# Patient Record
Sex: Male | Born: 1965 | Race: Black or African American | Hispanic: No | Marital: Single | Smoking: Current every day smoker
Health system: Southern US, Community
[De-identification: ages and names within clinical notes are randomized; demographics above are authoritative.]

## PROBLEM LIST (undated history)

## (undated) HISTORY — PX: APPENDECTOMY: SHX54

---

## 2003-09-29 ENCOUNTER — Other Ambulatory Visit: Payer: Self-pay

## 2004-09-04 ENCOUNTER — Emergency Department: Payer: Self-pay | Admitting: Emergency Medicine

## 2005-04-04 ENCOUNTER — Emergency Department: Payer: Self-pay | Admitting: Emergency Medicine

## 2006-01-12 ENCOUNTER — Emergency Department: Payer: Self-pay | Admitting: Unknown Physician Specialty

## 2006-01-16 ENCOUNTER — Emergency Department: Payer: Self-pay | Admitting: Unknown Physician Specialty

## 2007-06-15 ENCOUNTER — Emergency Department: Payer: Self-pay | Admitting: Emergency Medicine

## 2008-09-01 ENCOUNTER — Emergency Department: Payer: Self-pay | Admitting: Emergency Medicine

## 2011-12-31 ENCOUNTER — Emergency Department: Payer: Self-pay | Admitting: Internal Medicine

## 2014-09-23 ENCOUNTER — Emergency Department
Admit: 2014-09-23 | Disposition: A | Discharge status: Home / Self Care | Payer: Self-pay | Admitting: Emergency Medicine

## 2014-12-22 ENCOUNTER — Emergency Department
Admission: EM | Admit: 2014-12-22 | Discharge: 2014-12-22 | Disposition: A | Discharge status: Home / Self Care | Payer: No Typology Code available for payment source | Attending: Student | Admitting: Student

## 2014-12-22 DIAGNOSIS — Z880 Allergy status to penicillin: Secondary | ICD-10-CM | POA: Diagnosis not present

## 2014-12-22 DIAGNOSIS — H5711 Ocular pain, right eye: Secondary | ICD-10-CM | POA: Diagnosis not present

## 2014-12-22 MED ORDER — TETRACAINE HCL 0.5 % OP SOLN
OPHTHALMIC | Status: DC
Start: 2014-12-22 — End: 2014-12-23
  Filled 2014-12-22: qty 2

## 2014-12-22 MED ORDER — EYE WASH OPHTH SOLN
OPHTHALMIC | Status: AC
  Administered 2014-12-22
  Filled 2014-12-22: qty 118

## 2014-12-22 MED ORDER — OLOPATADINE HCL 0.1 % OP SOLN: 1.0000 [drp] | Freq: Two times a day (BID) | OPHTHALMIC | Status: AC

## 2014-12-22 MED ORDER — FLUORESCEIN SODIUM 1 MG OP STRP
ORAL_STRIP | OPHTHALMIC | Status: AC
  Filled 2014-12-22: qty 1

## 2014-12-22 NOTE — ED Notes (Signed)
Pt to ED with R eye redness, states he thinks a bug may have flown into his eye.

## 2014-12-22 NOTE — ED Provider Notes (Signed)
[facility] Emergency Department Provider Note  ____________________________________________  Time seen: Approximately 11:15 PM  I have reviewed the triage vital signs and the nursing notes.   HISTORY  Chief Complaint Eye Pain    HPI Samuel Dixon is a 49 y.o. male patient state right eye redness and irritation. Patient state he believed bug flew into his eye yesterday. Patient denies any vision disturbance. Patient is rating his pain/ discomfort as a 6/10.Palliative measures taken since the incident.   No past medical history on file.  There are no active problems to display for this patient.   No past surgical history on file.  No current outpatient prescriptions on file.  Allergies Penicillins and Sulfa antibiotics  No family history on file.  Social History History  Substance Use Topics  . Smoking status: Not on file  . Smokeless tobacco: Not on file  . Alcohol Use: Not on file    Review of Systems Constitutional: No fever/chills Eyes: No visual changes. Right eye irritation ENT: No sore throat. Cardiovascular: Denies chest pain. Respiratory: Denies shortness of breath. Gastrointestinal: No abdominal pain.  No nausea, no vomiting.  No diarrhea.  No constipation. Genitourinary: Negative for dysuria. Musculoskeletal: Negative for back pain. Skin: Negative for rash. Neurological: Negative for headaches, focal weakness or numbness. 10-point ROS otherwise negative.  ____________________________________________   PHYSICAL EXAM:  VITAL SIGNS: ED Triage Vitals  Enc Vitals Group     BP 12/22/14 2251 144/91 mmHg     Pulse Rate 12/22/14 2251 87     Resp 12/22/14 2251 18     Temp 12/22/14 2251 97.9 F (36.6 C)     Temp Source 12/22/14 2251 Oral     SpO2 12/22/14 2251 99 %     Weight 12/22/14 2251 220 lb (99.791 kg)     Height 12/22/14 2251 5\' 5"  (1.651 m)     Head Cir --      Peak Flow --      Pain Score 12/22/14 2252 6      Pain Loc --      Pain Edu? --      Excl. in GC? --     Constitutional: Alert and oriented. Well appearing and in no acute distress. Eyes: Conjunctivae are normal. PERRL. EOMI. Head: Atraumatic. Nose: No congestion/rhinnorhea. Mouth/Throat: Mucous membranes are moist.  Oropharynx non-erythematous. Neck: No stridor. No cervical spine tenderness to palpation. Hematological/Lymphatic/Immunilogical: No cervical lymphadenopathy. Cardiovascular: Normal rate, regular rhythm. Grossly normal heart sounds.  Good peripheral circulation. Respiratory: Normal respiratory effort.  No retractions. Lungs CTAB. Gastrointestinal: Soft and nontender. No distention. No abdominal bruits. No CVA tenderness. Musculoskeletal: No lower extremity tenderness nor edema.  No joint effusions. Neurologic:  Normal speech and language. No gross focal neurologic deficits are appreciated. Speech is normal. No gait instability. Skin:  Skin is warm, dry and intact. No rash noted. Psychiatric: Mood and affect are normal. Speech and behavior are normal.  ____________________________________________   LABS (all labs ordered are listed, but only abnormal results are displayed)  Labs Reviewed - No data to display ____________________________________________  EKG   ____________________________________________  RADIOLOGY   ____________________________________________   PROCEDURES  Procedure(s) performed: See procedure note  Critical Care performed: No  Tetracaine eyedrops were applied to the right eye. __Fluoroscopy stain revealed no foreign body or corneal lesions. Eye was then irrigated with 20 mL of normal saline. Patient tolerated procedure no complaints. ___________________________   INITIAL IMPRESSION / ASSESSMENT AND PLAN / ED COURSE  Pertinent labs &  imaging results that were available during my care of the patient were reviewed by me and considered in my medical decision making (see chart for  details).  Cornea irritation to the right eye. This patient to use any histamine eyedrops for the next 2-3 days. Patient advised to follow-up with [facility] if his condition worsens. Patient was discharged with Patanol eyedrops to use as directed. ____________________________________________   FINAL CLINICAL IMPRESSION(S) / ED DIAGNOSES  Final diagnoses:  Eye pain, right      Joni Reining, PA-C 12/22/14 1610  Gayla Doss, MD 12/26/14 9604

## 2014-12-22 NOTE — Discharge Instructions (Signed)
Use eye drops as directed.

## 2015-09-04 ENCOUNTER — Encounter: Payer: Self-pay | Admitting: Emergency Medicine

## 2015-09-04 ENCOUNTER — Emergency Department
Admission: EM | Admit: 2015-09-04 | Discharge: 2015-09-04 | Disposition: A | Discharge status: Home / Self Care | Payer: No Typology Code available for payment source | Attending: Emergency Medicine | Admitting: Emergency Medicine

## 2015-09-04 DIAGNOSIS — Z79899 Other long term (current) drug therapy: Secondary | ICD-10-CM | POA: Insufficient documentation

## 2015-09-04 DIAGNOSIS — Z880 Allergy status to penicillin: Secondary | ICD-10-CM | POA: Insufficient documentation

## 2015-09-04 DIAGNOSIS — F17200 Nicotine dependence, unspecified, uncomplicated: Secondary | ICD-10-CM | POA: Insufficient documentation

## 2015-09-04 DIAGNOSIS — K047 Periapical abscess without sinus: Secondary | ICD-10-CM | POA: Diagnosis not present

## 2015-09-04 DIAGNOSIS — K0889 Other specified disorders of teeth and supporting structures: Secondary | ICD-10-CM | POA: Diagnosis present

## 2015-09-04 MED ORDER — CLINDAMYCIN HCL 300 MG PO CAPS: 300.0000 mg | ORAL_CAPSULE | Freq: Three times a day (TID) | ORAL | Status: DC

## 2015-09-04 MED ORDER — OXYCODONE-ACETAMINOPHEN 5-325 MG PO TABS: 1.0000 | ORAL_TABLET | Freq: Four times a day (QID) | ORAL | Status: DC | PRN

## 2015-09-04 NOTE — ED Notes (Signed)
Pt presents to ED via POV from home with c/o of right lower dental pain. Pt states"I think I need antibiotics." Pt states presenting sx related to "infected tooth". Pt states area is swollen and sore. Pt denies abnormal drainage at this time. Pt alert and oriented x4.

## 2015-09-04 NOTE — ED Notes (Signed)
Patient with no complaints at this time. Respirations even and unlabored. Skin warm/dry. Discharge instructions reviewed with patient at this time. Patient given opportunity to voice concerns/ask questions. Patient discharged at this time and left Emergency Department with steady gait.   

## 2015-09-04 NOTE — Discharge Instructions (Signed)
OPTIONS FOR DENTAL FOLLOW UP CARE ° °[place] Department of Health and Human Services - Local Safety Net Dental Clinics °http://www.ncdhhs.gov/dph/oralhealth/services/safetynetclinics.htm °  °[facility] (336-562-3123) ° °[facility] [place] ([phone]) ° °[facility] ([phone] ext [phone]) ° °[facility] ([phone]) ° °[facility] ([phone]) °This clinic caters to the indigent population and is on a lottery system. °Location: °[facility], [address], [place] °Clinic Hours: °Wednesdays from 6pm - 9pm, patients seen by a lottery system. °For dates, call or go to www.med.[facility].edu/[facility]/patients/Dental-[facility] °Services: °Cleanings, fillings and simple extractions. °Payment Options: °DENTAL WORK IS FREE OF CHARGE. Bring proof of income or support. °Best way to get seen: °Arrive at 5:15 pm - this is a lottery, NOT first come/first serve, so arriving earlier will not increase your chances of being seen. °  °  °[facility] °[phone] °Select option 1 for emergencies °  °Location: °[facility], [address], [place] °Clinic Hours: °No walk-ins accepted - call the day before to schedule an appointment. °Check in times are 9:30 am and 1:30 pm. °Services: °Simple extractions, temporary fillings, pulpectomy/pulp debridement, uncomplicated abscess drainage. °Payment Options: °PAYMENT IS DUE AT THE TIME OF SERVICE.  Fee is usually $100-200, additional surgical procedures (e.g. abscess drainage) may be extra. °Cash, checks, Visa/MasterCard accepted.  Can file Medicaid if patient is covered for dental - patient should call case worker to check. °No discount for [facility] patients. °Best way to get seen: °MUST call the day before and get onto the schedule. Can usually be seen the next 1-2 days. No walk-ins accepted. °  °  °[facility] °[phone] °   °Location: °[facility], [address], [place] °Clinic Hours: °M, W, Th, F 8am or 1:30pm, Tues 9a or 1:30 - first come/first served. °Services: °Simple extractions, temporary fillings, uncomplicated abscess drainage.  You do not need to be an [place] resident. °Payment Options: °PAYMENT IS DUE AT THE TIME OF SERVICE. °Dental insurance, otherwise sliding scale - bring proof of income or support. °Depending on income and treatment needed, cost is usually $50-200. °Best way to get seen: °Arrive early as it is first come/first served. °  °  °[facility] °[phone] °  °Location: °[address] °Clinic Hours: °Mon-Thu 8a-5p °Services: °Most basic dental services including extractions and fillings. °Payment Options: °PAYMENT IS DUE AT THE TIME OF SERVICE. °Sliding scale, up to 50% off - bring proof if income or support. °Medicaid with dental option accepted. °Best way to get seen: °Call to schedule an appointment, can usually be seen within 2 weeks OR they will try to see walk-ins - show up at 8a or 2p (you may have to wait). °  °  °[facility] °919-245-2435 °[place] RESIDENTS ONLY °  °Location: °[facility], [address], [place], [place] [address] °Clinic Hours: By appointment only. °Monday - Thursday 8am-5pm, Friday 8am-12pm °Services: Cleanings, fillings, extractions. °Payment Options: °PAYMENT IS DUE AT THE TIME OF SERVICE. °Cash, Visa or MasterCard. Sliding scale - $30 minimum per service. °Best way to get seen: °Come in to office, complete packet and make an appointment - need proof of income °or support monies for each household member and proof of [place] residence. °Usually takes about a month to get in. °  °  °[facility] °[phone] °  °Location: °[address]   [place] °Clinic Hours: Walk-in Urgent Care Dental Services are offered Monday-Friday  mornings only. °The numbers of emergencies accepted daily is limited to the number of °providers available. °Maximum 15 - Mondays, Wednesdays & Thursdays °Maximum 10 - Tuesdays & Fridays °Services: °You do not need to be a [place] resident to be seen for a dental emergency. °Emergencies are defined as pain, swelling, abnormal bleeding, or dental trauma. Walkins will receive x-rays if needed. °NOTE: Dental cleaning is not an emergency. °Payment Options: °PAYMENT IS DUE AT THE TIME OF SERVICE. °Minimum co-pay is $40.00 for uninsured patients. °Minimum co-pay is $3.00 for Medicaid with dental coverage. °Dental Insurance is accepted and must be presented at time of visit. °Medicare does not cover dental. °Forms of payment: Cash, credit card, checks. °Best way to get seen: °If not previously registered with the clinic, walk-in dental registration begins at 7:15 am and is on a first come/first serve basis. °If previously registered with the clinic, call to make an appointment. °  °  °[facility] °[phone] °[place] RESIDENTS ONLY °  °Location: °[address], [place], [place] °Clinic Hours: °Mon-Thu 10a-2p °Services: Extractions only! °Payment Options: °FREE (donations accepted) - bring proof of income or support °Best way to get seen: °Call and schedule an appointment OR come at 8am on the 1st Monday of every month (except for holidays) when it is first come/first served. °  °  °[facility] °[phone] °  °Location: °[address], [place] °Clinic Hours: °Friday mornings °Services, Payment Options, Best way to get seen: °Call for info ° ° °Dental Abscess °A dental abscess is a collection of pus in or around a tooth. °CAUSES °This condition is caused by a bacterial infection around the root of the tooth that involves the inner part of the tooth (pulp). It may result from: °· Severe tooth decay. °· Trauma to the tooth that allows bacteria to enter into the pulp, such as a broken or chipped  tooth. °· Severe gum disease around a tooth. °SYMPTOMS °Symptoms of this condition include: °· Severe pain in and around the infected tooth. °· Swelling and redness around the infected tooth, in the mouth, or in the face. °· Tenderness. °· Pus drainage. °· Bad breath. °· Bitter taste in the mouth. °· Difficulty swallowing. °· Difficulty opening the mouth. °· Nausea. °· Vomiting. °· Chills. °· Swollen neck glands. °· Fever. °DIAGNOSIS °This condition is diagnosed with examination of the infected tooth. During the exam, your dentist may tap on the infected tooth. Your dentist will also ask about your medical and dental history and may order X-rays. °TREATMENT °This condition is treated by eliminating the infection. This may be done with: °· Antibiotic medicine. °· A root canal. This may be performed to save the tooth. °· Pulling (extracting) the tooth. This may also involve draining the abscess. This is done if the tooth cannot be saved. °HOME CARE INSTRUCTIONS °· Take medicines only as directed by your dentist. °· If you were prescribed antibiotic medicine, finish all of it even if you start to feel better. °· Rinse your mouth (gargle) often with salt water to relieve pain or swelling. °· Do not drive or operate heavy machinery while taking pain medicine. °· Do not apply heat to the outside of your mouth. °· Keep all follow-up visits as directed by your dentist. This is important. °SEEK MEDICAL CARE IF: °· Your pain is worse and is not helped by medicine. °SEEK IMMEDIATE MEDICAL CARE IF: °·   You have a fever or chills. °· Your symptoms suddenly get worse. °· You have a very bad headache. °· You have problems breathing or swallowing. °· You have trouble opening your mouth. °· You have swelling in your neck or around your eye. °  °This information is not intended to replace advice given to you by your health care provider. Make sure you discuss any questions you have with your health care provider. °  °Document  Released: 06/03/2005 Document Revised: 10/18/2014 Document Reviewed: 05/31/2014 °Elsevier Interactive Patient Education ©2016 [employer] Inc[employer] ° °

## 2015-09-04 NOTE — ED Provider Notes (Signed)
[facility] Emergency Department Provider Note ____________________________________________  Time seen: Approximately 8:56 PM  I have reviewed the triage vital signs and the nursing notes.   HISTORY  Chief Complaint Dental Pain   HPI Yacoub Diltz is a 50 y.o. male who presents to the emergency department for evaluation of a dental abscess. He states that approximately 3 days ago he began having dental pain and today his right lower jaw is swollen. He does not have a dentist. He has not taken anything for pain.  History reviewed. No pertinent past medical history.  There are no active problems to display for this patient.   Past Surgical History  Procedure Laterality Date  . Appendectomy      Current Outpatient Rx  Name  Route  Sig  Dispense  Refill  . LORATADINE PO   Oral   Take by mouth.         . clindamycin (CLEOCIN) 300 MG capsule   Oral   Take 1 capsule (300 mg total) by mouth 3 (three) times daily.   30 capsule   0   . olopatadine (PATANOL) 0.1 % ophthalmic solution   Right Eye   Place 1 drop into the right eye 2 (two) times daily.   5 mL   0   . oxyCODONE-acetaminophen (ROXICET) 5-325 MG tablet   Oral   Take 1 tablet by mouth every 6 (six) hours as needed.   9 tablet   0     Allergies Penicillins and Sulfa antibiotics  No family history on file.  Social History Social History  Substance Use Topics  . Smoking status: Current Every Day Smoker  . Smokeless tobacco: None  . Alcohol Use: No    Review of Systems Constitutional: No fever/chills ENT: No sore throat. Gastrointestinal: No nausea, no vomiting.  Musculoskeletal: Negative for pain. Skin: Positive for swelling of the right lower jaw Neurological: Negative for headaches, focal weakness or numbness.   ____________________________________________   PHYSICAL EXAM:  VITAL SIGNS: ED Triage Vitals  Enc Vitals Group     BP 09/04/15 2001 144/85 mmHg   Pulse Rate 09/04/15 2001 85     Resp 09/04/15 2001 18     Temp 09/04/15 2001 98.2 F (36.8 C)     Temp Source 09/04/15 2001 Oral     SpO2 09/04/15 2001 96 %     Weight 09/04/15 2001 220 lb (99.791 kg)     Height 09/04/15 2001  (1.651 m)     Head Cir --      Peak Flow --      Pain Score 09/04/15 2007 10     Pain Loc --      Pain Edu? --      Excl. in GC? --     Constitutional: Alert and oriented. Well appearing and in no acute distress. Eyes: Conjunctivae are normal. PERRL. EOMI. Head: Atraumatic. Nose: No congestion/rhinnorhea. Mouth/Throat: Mucous membranes are moist.  Oropharynx non-erythematous. Periodontal Exam: No fluctuant area noted around the gumline of the right lower jaw Neck: No stridor.  Hematological/Lymphatic/Immunilogical: No cervical lymphadenopathy. Respiratory: Normal respiratory effort.  No retractions. Musculoskeletal: Full ROM x 4 extremities. Neurologic:  Normal speech and language. No gross focal neurologic deficits are appreciated. Speech is normal. No gait instability. Skin:  Skin is warm, dry and intact. Mild edema of the right lower jaw. Psychiatric: Mood and affect are normal. Speech and behavior are normal.  ____________________________________________   LABS (all labs ordered are listed,  but only abnormal results are displayed)  Labs Reviewed - No data to display ____________________________________________   RADIOLOGY   ____________________________________________   PROCEDURES  Procedure(s) performed: None  Critical Care performed: No  ____________________________________________   INITIAL IMPRESSION / ASSESSMENT AND PLAN / ED COURSE  Pertinent labs & imaging results that were available during my care of the patient were reviewed by me and considered in my medical decision making (see chart for details).  Patient was advised to see the dentist within 14 days. Also advised to take the antibiotic until finished. Instructed  to return to the ER for symptoms that change or worsen if you are unable to schedule an appointment. ____________________________________________   FINAL CLINICAL IMPRESSION(S) / ED DIAGNOSES  Final diagnoses:  Dental abscess       Chinita PesterCari B Alixis Harmon, FNP 09/04/15 2321  Minna AntisKevin Paduchowski, MD 09/04/15 2341

## 2015-10-12 ENCOUNTER — Emergency Department: Payer: No Typology Code available for payment source

## 2015-10-12 ENCOUNTER — Emergency Department
Admission: EM | Admit: 2015-10-12 | Discharge: 2015-10-12 | Disposition: A | Discharge status: Home / Self Care | Payer: No Typology Code available for payment source | Attending: Emergency Medicine | Admitting: Emergency Medicine

## 2015-10-12 ENCOUNTER — Encounter: Payer: Self-pay | Admitting: Emergency Medicine

## 2015-10-12 DIAGNOSIS — W1789XA Other fall from one level to another, initial encounter: Secondary | ICD-10-CM | POA: Diagnosis not present

## 2015-10-12 DIAGNOSIS — Y9389 Activity, other specified: Secondary | ICD-10-CM | POA: Insufficient documentation

## 2015-10-12 DIAGNOSIS — F17200 Nicotine dependence, unspecified, uncomplicated: Secondary | ICD-10-CM | POA: Diagnosis not present

## 2015-10-12 DIAGNOSIS — Y999 Unspecified external cause status: Secondary | ICD-10-CM | POA: Diagnosis not present

## 2015-10-12 DIAGNOSIS — Y929 Unspecified place or not applicable: Secondary | ICD-10-CM | POA: Insufficient documentation

## 2015-10-12 DIAGNOSIS — Z792 Long term (current) use of antibiotics: Secondary | ICD-10-CM | POA: Insufficient documentation

## 2015-10-12 DIAGNOSIS — S91011A Laceration without foreign body, right ankle, initial encounter: Secondary | ICD-10-CM | POA: Insufficient documentation

## 2015-10-12 DIAGNOSIS — S9001XA Contusion of right ankle, initial encounter: Secondary | ICD-10-CM

## 2015-10-12 NOTE — ED Notes (Signed)
Pt comes into the ED via POV c/o laceration to the right ankle.  Tailgate to a trailer fell open and landed down his ankle. Patient in NAD at this time and ambulated well to patient room.

## 2015-10-12 NOTE — ED Provider Notes (Signed)
[facility] Emergency Department Provider Note  ____________________________________________  Time seen: Approximately 10:20 PM  I have reviewed the triage vital signs and the nursing notes.   HISTORY  Chief Complaint Extremity Laceration    HPI Samuel Dixon is a 50 y.o. male , NAD, presents to the emergency department with complaint of right ankle pain and laceration. States attempted from a trailer fell and hit his ankle causing a laceration. He denies any numbness, weakness, tingling to the ankle. Has been able to bear weight but with some pain. Bleeding about the laceration is been controlled by wrapped in gauze.   History reviewed. No pertinent past medical history.  There are no active problems to display for this patient.   Past Surgical History  Procedure Laterality Date  . Appendectomy      Current Outpatient Rx  Name  Route  Sig  Dispense  Refill  . clindamycin (CLEOCIN) 300 MG capsule   Oral   Take 1 capsule (300 mg total) by mouth 3 (three) times daily.   30 capsule   0   . LORATADINE PO   Oral   Take by mouth.         Marland Kitchen. olopatadine (PATANOL) 0.1 % ophthalmic solution   Right Eye   Place 1 drop into the right eye 2 (two) times daily.   5 mL   0   . oxyCODONE-acetaminophen (ROXICET) 5-325 MG tablet   Oral   Take 1 tablet by mouth every 6 (six) hours as needed.   9 tablet   0     Allergies Latex; Penicillins; and Sulfa antibiotics  No family history on file.  Social History Social History  Substance Use Topics  . Smoking status: Current Every Day Smoker  . Smokeless tobacco: None  . Alcohol Use: No     Review of Systems  Constitutional: No fatigue Eyes: No visual changes.  Cardiovascular: No chest pain. Respiratory: No cough. No shortness of breath. Musculoskeletal: Positive right ankle pain. Negative for knee, foot pain.  Skin: Positive laceration right ankle. Negative for rash, bruising,  swelling. Neurological: Negative for headaches, focal weakness or numbness. No tingling 10-point ROS otherwise negative.  ____________________________________________   PHYSICAL EXAM:  VITAL SIGNS: ED Triage Vitals  Enc Vitals Group     BP 10/12/15 2101 130/88 mmHg     Pulse Rate 10/12/15 2101 100     Resp 10/12/15 2101 16     Temp 10/12/15 2101 98.3 F (36.8 C)     Temp Source 10/12/15 2101 Oral     SpO2 10/12/15 2101 100 %     Weight 10/12/15 2101 216 lb (97.977 kg)     Height 10/12/15 2101 5\' 4"  (1.626 m)     Head Cir --      Peak Flow --      Pain Score 10/12/15 2102 6     Pain Loc --      Pain Edu? --      Excl. in GC? --      Constitutional: Alert and oriented. Well appearing and in no acute distress. Playing a handheld video game in the room. Eyes: Conjunctivae are normal. Head: Atraumatic. Cardiovascular: Good peripheral circulation with 2+ pulses noted in bilateral lower extremities. Capillary refill less than 3 seconds in all digits of the right lower extremity. Respiratory: Normal respiratory effort without tachypnea or retractions.  Musculoskeletal: Full range of motion of the right ankle. No laxity with anterior and posterior drawer. No laxity  with varus and valgus stress. No lower extremity tenderness nor edema.  No joint effusions. Neurologic:  Normal speech and language. No gross focal neurologic deficits are appreciated. Sensation intact about the right lower extremity. Skin:  5cm superficial laceration noted about the medial right ankle. No surrounding edema or erythema. No bruising noted. Skin is warm, dry. No rash noted. Psychiatric: Mood and affect are normal. Speech and behavior are normal. Patient exhibits appropriate insight and judgement.   ____________________________________________   LABS  None ____________________________________________  EKG  None ____________________________________________  RADIOLOGY I have personally viewed and  evaluated these images (plain radiographs) as part of my medical decision making, as well as reviewing the written report by the radiologist.  Dg Ankle Complete Right  10/12/2015  CLINICAL DATA:  Patient c/o laceration to the right ankle. Tailgate to a trailer fell open and landed down his ankle. Initial encounter. EXAM: RIGHT ANKLE - COMPLETE 3+ VIEW COMPARISON:  None. FINDINGS: Normal anatomic alignment. No evidence for acute fracture or dislocation. Soft tissue swelling and gas within the soft tissues overlying the distal tibia. IMPRESSION: Swelling and gas within the soft tissues overlying the distal tibia most compatible with soft tissue injury. No acute osseous abnormality. Electronically Signed   By: Annia Belt M.D.   On: 10/12/2015 21:55    ____________________________________________    PROCEDURES  Procedure(s) performed: LACERATION REPAIR Performed by: Hope Pigeon Authorized by: Hope Pigeon Consent: Verbal consent obtained. Risks and benefits: risks, benefits and alternatives were discussed Consent given by: patient Patient identity confirmed: provided demographic data Prepped and Draped in normal sterile fashion Wound explored  Laceration Location: Right medial ankle  Laceration Length: 5cm  No Foreign Bodies seen or palpated  Anesthesia: None  Irrigation method: syringe Amount of cleaning: standard  Skin closure: Steri strips  Number of strips: 3  Patient tolerance: Patient tolerated the procedure well with no immediate complications.      Medications - No data to display   ____________________________________________   INITIAL IMPRESSION / ASSESSMENT AND PLAN / ED COURSE  Pertinent imaging results that were available during my care of the patient were reviewed by me and considered in my medical decision making (see chart for details).  Patient's diagnosis is consistent with laceration of right ankle and right ankle contusion. Patient will be  discharged home with instructions to take Tylenol or ibuprofen as needed for pain. May apply ice to the affected ankle 20 minutes 3-4 times daily. Should keep the right ankle elevated as needed. Patient understands that Steri-Strips will fall off within the next week. Patient is to follow up with his primary care provider in 2 days for wound recheck as needed. Patient is given ED precautions to return to the ED for any worsening or new symptoms.      ____________________________________________  FINAL CLINICAL IMPRESSION(S) / ED DIAGNOSES  Final diagnoses:  Laceration of right ankle, initial encounter  Ankle contusion, right, initial encounter      NEW MEDICATIONS STARTED DURING THIS VISIT:  New Prescriptions   No medications on file         Hope Pigeon, PA-C 10/12/15 2236  Jeanmarie Plant, MD 10/12/15 2321

## 2015-10-12 NOTE — Discharge Instructions (Signed)
Laceration Care, Adult °A laceration is a cut that goes through all of the layers of the skin and into the tissue that is right under the skin. Some lacerations heal on their own. Others need to be closed with stitches (sutures), staples, skin adhesive strips, or skin glue. Proper laceration care minimizes the risk of infection and helps the laceration to heal better. °HOW TO CARE FOR YOUR LACERATION °If sutures or staples were used: °· Keep the wound clean and dry. °· If you were given a bandage (dressing), you should change it at least one time per day or as told by your health care provider. You should also change it if it becomes wet or dirty. °· Keep the wound completely dry for the first 24 hours or as told by your health care provider. After that time, you may shower or bathe. However, make sure that the wound is not soaked in water until after the sutures or staples have been removed. °· Clean the wound one time each day or as told by your health care provider: °· Wash the wound with soap and water. °· Rinse the wound with water to remove all soap. °· Pat the wound dry with a clean towel. Do not rub the wound. °· After cleaning the wound, apply a thin layer of antibiotic ointment as told by your health care provider. This will help to prevent infection and keep the dressing from sticking to the wound. °· Have the sutures or staples removed as told by your health care provider. °If skin adhesive strips were used: °· Keep the wound clean and dry. °· If you were given a bandage (dressing), you should change it at least one time per day or as told by your health care provider. You should also change it if it becomes dirty or wet. °· Do not get the skin adhesive strips wet. You may shower or bathe, but be careful to keep the wound dry. °· If the wound gets wet, pat it dry with a clean towel. Do not rub the wound. °· Skin adhesive strips fall off on their own. You may trim the strips as the wound heals. Do not  remove skin adhesive strips that are still stuck to the wound. They will fall off in time. °If skin glue was used: °· Try to keep the wound dry, but you may briefly wet it in the shower or bath. Do not soak the wound in water, such as by swimming. °· After you have showered or bathed, gently pat the wound dry with a clean towel. Do not rub the wound. °· Do not do any activities that will make you sweat heavily until the skin glue has fallen off on its own. °· Do not apply liquid, cream, or ointment medicine to the wound while the skin glue is in place. Using those may loosen the film before the wound has healed. °· If you were given a bandage (dressing), you should change it at least one time per day or as told by your health care provider. You should also change it if it becomes dirty or wet. °· If a dressing is placed over the wound, be careful not to apply tape directly over the skin glue. Doing that may cause the glue to be pulled off before the wound has healed. °· Do not pick at the glue. The skin glue usually remains in place for 5-10 days, then it falls off of the skin. °General Instructions °· Take over-the-counter and prescription   medicines only as told by your health care provider. °· If you were prescribed an antibiotic medicine or ointment, take or apply it as told by your doctor. Do not stop using it even if your condition improves. °· To help prevent scarring, make sure to cover your wound with sunscreen whenever you are outside after stitches are removed, after adhesive strips are removed, or when glue remains in place and the wound is healed. Make sure to wear a sunscreen of at least 30 SPF. °· Do not scratch or pick at the wound. °· Keep all follow-up visits as told by your health care provider. This is important. °· Check your wound every day for signs of infection. Watch for: °· Redness, swelling, or pain. °· Fluid, blood, or pus. °· Raise (elevate) the injured area above the level of your heart  while you are sitting or lying down, if possible. °SEEK MEDICAL CARE IF: °· You received a tetanus shot and you have swelling, severe pain, redness, or bleeding at the injection site. °· You have a fever. °· A wound that was closed breaks open. °· You notice a bad smell coming from your wound or your dressing. °· You notice something coming out of the wound, such as wood or glass. °· Your pain is not controlled with medicine. °· You have increased redness, swelling, or pain at the site of your wound. °· You have fluid, blood, or pus coming from your wound. °· You notice a change in the color of your skin near your wound. °· You need to change the dressing frequently due to fluid, blood, or pus draining from the wound. °· You develop a new rash. °· You develop numbness around the wound. °SEEK IMMEDIATE MEDICAL CARE IF: °· You develop severe swelling around the wound. °· Your pain suddenly increases and is severe. °· You develop painful lumps near the wound or on skin that is anywhere on your body. °· You have a red streak going away from your wound. °· The wound is on your hand or foot and you cannot properly move a finger or toe. °· The wound is on your hand or foot and you notice that your fingers or toes look pale or bluish. °  °This information is not intended to replace advice given to you by your health care provider. Make sure you discuss any questions you have with your health care provider. °  °Document Released: 06/03/2005 Document Revised: 10/18/2014 Document Reviewed: 05/30/2014 °Elsevier Interactive Patient Education ©2016 [employer] ° ° °Stitches, Staples, or Adhesive Wound Closure °Doctors use stitches (sutures), staples, and certain glue (skin adhesives) to hold your skin together while it heals (wound closure). You may need this treatment after you have surgery or if you cut your skin accidentally. These methods help your skin heal more quickly. They also make it less likely that you will have a  scar. °WHAT ARE THE DIFFERENT KINDS OF WOUND CLOSURES? °There are many options for wound closure. The one that your doctor uses depends on how deep and large your wound is. °Adhesive Glue °To use this glue to close a wound, your doctor holds the edges of the wound together and paints the glue on the surface of your skin. You may need more than one layer of glue. Then the wound may be covered with a light bandage (dressing). °This type of skin closure may be used for small wounds that are not deep (superficial). Using glue for wound closure is less painful   than other methods. It does not require a medicine that numbs the area. This method also leaves nothing to be removed. Adhesive glue is often used for children and on facial wounds. °Adhesive glue cannot be used for wounds that are deep, uneven, or bleeding. It is not used inside of a wound.  °Adhesive Strips °These strips are made of sticky (adhesive), porous paper. They are placed across your skin edges like a regular adhesive bandage. You leave them on until they fall off. °Adhesive strips may be used to close very superficial wounds. They may also be used along with sutures to improve closure of your skin edges.  °Sutures °Sutures are the oldest method of wound closure. Sutures can be made from natural or synthetic materials. They can be made from a material that your body can break down as your wound heals (absorbable), or they can be made from a material that needs to be removed from your skin (nonabsorbable). They come in many different strengths and sizes. °Your doctor attaches the sutures to a steel needle on one end. Sutures can be passed through your skin, or through the tissues beneath your skin. Then they are tied and cut. Your skin edges may be closed in one continuous stitch or in separate stitches. °Sutures are strong and can be used for all kinds of wounds. Absorbable sutures may be used to close tissues under the skin. The disadvantage of sutures  is that they may cause skin reactions that lead to infection. Nonabsorbable sutures need to be removed. °Staples °When surgical staples are used to close a wound, the edges of your skin on both sides of the wound are brought close together. A staple is placed across the wound, and an instrument secures the edges together. Staples are often used to close surgical cuts (incisions). °Staples are faster to use than sutures, and they cause less reaction from your skin. Staples need to be removed using a tool that bends the staples away from your skin. °HOW DO I CARE FOR MY WOUND CLOSURE? °· Take medicines only as told by your doctor. °· If you were prescribed an antibiotic medicine for your wound, finish it all even if you start to feel better. °· Use ointments or creams only as told by your doctor. °· Wash your hands with soap and water before and after touching your wound. °· Do not soak your wound in water. Do not take baths, swim, or use a hot tub until your doctor says it is okay. °· Ask your doctor when you can start showering. Cover your wound if told by your doctor. °· Do not take out your own sutures or staples. °· Do not pick at your wound. Picking can cause an infection. °· Keep all follow-up visits as told by your doctor. This is important. °HOW LONG WILL I HAVE MY WOUND CLOSURE?  °· Leave adhesive glue on your skin until the glue peels away. °· Leave adhesive strips on your skin until they fall off. °· Absorbable sutures will dissolve within several days. °· Nonabsorbable sutures and staples must be removed. The location of the wound will determine how long they stay in. This can range from several days to a couple of weeks. °WHEN SHOULD I SEEK HELP FOR MY WOUND CLOSURE? °Contact your doctor if: °· You have a fever. °· You have chills. °· You have redness, puffiness (swelling), or pain at the site of your wound. °· You have fluid, blood, or pus coming from your wound. °·   There is a bad smell coming from your  wound. °· The skin edges of your wound start to separate after your sutures have been removed. °· Your wound becomes thick, raised, and darker in color after your sutures come out (scarring). °  °This information is not intended to replace advice given to you by your health care provider. Make sure you discuss any questions you have with your health care provider. °  °Document Released: 03/31/2009 Document Revised: 06/24/2014 Document Reviewed: 11/10/2013 °Elsevier Interactive Patient Education ©2016 [employer] ° °

## 2015-12-08 DIAGNOSIS — E669 Obesity, unspecified: Secondary | ICD-10-CM | POA: Diagnosis present

## 2015-12-08 DIAGNOSIS — F17200 Nicotine dependence, unspecified, uncomplicated: Secondary | ICD-10-CM | POA: Diagnosis present

## 2016-03-22 ENCOUNTER — Emergency Department: Payer: No Typology Code available for payment source

## 2016-03-22 ENCOUNTER — Encounter: Payer: Self-pay | Admitting: Emergency Medicine

## 2016-03-22 ENCOUNTER — Emergency Department
Admission: EM | Admit: 2016-03-22 | Discharge: 2016-03-22 | Disposition: A | Discharge status: Home / Self Care | Payer: No Typology Code available for payment source | Attending: Emergency Medicine | Admitting: Emergency Medicine

## 2016-03-22 DIAGNOSIS — Z91040 Latex allergy status: Secondary | ICD-10-CM | POA: Diagnosis not present

## 2016-03-22 DIAGNOSIS — R1033 Periumbilical pain: Secondary | ICD-10-CM | POA: Insufficient documentation

## 2016-03-22 DIAGNOSIS — R109 Unspecified abdominal pain: Secondary | ICD-10-CM | POA: Diagnosis present

## 2016-03-22 DIAGNOSIS — Z79899 Other long term (current) drug therapy: Secondary | ICD-10-CM | POA: Diagnosis not present

## 2016-03-22 DIAGNOSIS — F17200 Nicotine dependence, unspecified, uncomplicated: Secondary | ICD-10-CM | POA: Diagnosis not present

## 2016-03-22 LAB — CBC
HCT: 45.8 % (ref 40.0–52.0)
HEMOGLOBIN: 15.4 g/dL (ref 13.0–18.0)
MCH: 30.9 pg (ref 26.0–34.0)
MCHC: 33.5 g/dL (ref 32.0–36.0)
MCV: 92.1 fL (ref 80.0–100.0)
Platelets: 232 10*3/uL (ref 150–440)
RBC: 4.98 MIL/uL (ref 4.40–5.90)
RDW: 13.1 % (ref 11.5–14.5)
WBC: 15.5 10*3/uL — ABNORMAL HIGH (ref 3.8–10.6)

## 2016-03-22 LAB — COMPREHENSIVE METABOLIC PANEL
ALBUMIN: 4.7 g/dL (ref 3.5–5.0)
ALK PHOS: 79 U/L (ref 38–126)
ALT: 26 U/L (ref 17–63)
ANION GAP: 7 (ref 5–15)
AST: 28 U/L (ref 15–41)
BUN: 15 mg/dL (ref 6–20)
CALCIUM: 9.8 mg/dL (ref 8.9–10.3)
CO2: 25 mmol/L (ref 22–32)
Chloride: 104 mmol/L (ref 101–111)
Creatinine, Ser: 1.23 mg/dL (ref 0.61–1.24)
GFR calc Af Amer: >60 mL/min (ref >60)
GFR calc non Af Amer: >60 mL/min (ref >60)
Glucose, Bld: 111 mg/dL — ABNORMAL HIGH (ref 65–99)
Potassium: 5 mmol/L (ref 3.5–5.1)
Sodium: 136 mmol/L (ref 135–145)
Total Bilirubin: 0.9 mg/dL (ref 0.3–1.2)
Total Protein: 8.2 g/dL — ABNORMAL HIGH (ref 6.5–8.1)

## 2016-03-22 LAB — URINALYSIS COMPLETE WITH MICROSCOPIC (ARMC ONLY)
Bacteria, UA: NONE SEEN
Bilirubin Urine: NEGATIVE
GLUCOSE, UA: 50 mg/dL — AB
Hgb urine dipstick: NEGATIVE
KETONES UR: NEGATIVE mg/dL
Leukocytes, UA: NEGATIVE
Nitrite: NEGATIVE
PROTEIN: NEGATIVE mg/dL
Specific Gravity, Urine: 1.02 (ref 1.005–1.030)
pH: 6 (ref 5.0–8.0)

## 2016-03-22 LAB — LIPASE, BLOOD: Lipase: 19 U/L (ref 11–51)

## 2016-03-22 MED ORDER — IOPAMIDOL (ISOVUE-300) INJECTION 61%
30.0000 mL | Freq: Once | INTRAVENOUS | Status: AC | PRN
  Administered 2016-03-22: 30 mL via ORAL

## 2016-03-22 MED ORDER — SODIUM CHLORIDE 0.9 % IV BOLUS (SEPSIS)
1000.0000 mL | Freq: Once | INTRAVENOUS | Status: AC
  Administered 2016-03-22: 1000 mL via INTRAVENOUS

## 2016-03-22 MED ORDER — ONDANSETRON HCL 4 MG/2ML IJ SOLN
4.0000 mg | Freq: Once | INTRAMUSCULAR | Status: AC
  Administered 2016-03-22: 4 mg via INTRAVENOUS
  Filled 2016-03-22: qty 2

## 2016-03-22 MED ORDER — IOPAMIDOL (ISOVUE-300) INJECTION 61%
100.0000 mL | Freq: Once | INTRAVENOUS | Status: AC | PRN
  Administered 2016-03-22: 100 mL via INTRAVENOUS

## 2016-03-22 MED ORDER — MORPHINE SULFATE (PF) 4 MG/ML IV SOLN
4.0000 mg | Freq: Once | INTRAVENOUS | Status: AC
  Administered 2016-03-22: 4 mg via INTRAVENOUS
  Filled 2016-03-22: qty 1

## 2016-03-22 NOTE — Discharge Instructions (Signed)
You were evaluated for abdominal pain, and although no certain cause was found, your exam and evaluation are reassuring in the emergency department.  Please try over-the-counter stool softener such as Colace.  Return to the emergency room for any worsening condition including worsening pain, vomiting blood, black or bloody stool, fever, or any other symptoms concerning to you.

## 2016-03-22 NOTE — ED Provider Notes (Signed)
[facility] Emergency Department Provider Note ____________________________________________   I have reviewed the triage vital signs and the triage nursing note.  HISTORY  Chief Complaint Abdominal Pain   Historian Patient  HPI Samuel Dixon is a 50 y.o. male with a history of appendectomy, presents today with abdominal pain which he points to his mid abdomen just above his umbilicus and states that it occurs in waves and at times is severe, currently 3 out of 10.  This before. No black or bloody vomitus. Mild nausea without vomiting. No fever.  No shortness of breath or chest pain.  Nothing seems to make it worse or better.    History reviewed. No pertinent past medical history.  There are no active problems to display for this patient.   Past Surgical History:  Procedure Laterality Date  . APPENDECTOMY      Prior to Admission medications   Medication Sig Start Date End Date Taking? Authorizing Provider  LORATADINE PO Take by mouth.   Yes Historical Provider, MD    Allergies  Allergen Reactions  . Latex Hives  . Penicillins Hives  . Sulfa Antibiotics Hives    History reviewed. No pertinent family history.  Social History Social History  Substance Use Topics  . Smoking status: Current Every Day Smoker  . Smokeless tobacco: Never Used  . Alcohol use No    Review of Systems  Constitutional: Negative for fever. Eyes: Negative for visual changes. ENT: Negative for sore throat. Cardiovascular: Negative for chest pain. Respiratory: Negative for shortness of breath. Gastrointestinal: As per history of present illness. Genitourinary: Negative for dysuria. Musculoskeletal: Negative for back pain. Skin: Negative for rash. Neurological: Negative for headache. 10 point Review of Systems otherwise negative ____________________________________________   PHYSICAL EXAM:  VITAL SIGNS: ED Triage Vitals  Enc Vitals Group     BP 03/22/16  0713 137/87     Pulse Rate 03/22/16 0713 73     Resp 03/22/16 0713 18     Temp 03/22/16 0713 98 F (36.7 C)     Temp Source 03/22/16 0713 Oral     SpO2 03/22/16 0713 100 %     Weight 03/22/16 0714 216 lb (98 kg)     Height --      Head Circumference --      Peak Flow --      Pain Score 03/22/16 0714 6     Pain Loc --      Pain Edu? --      Excl. in GC? --      Constitutional: Alert and oriented. Well appearing and in no distress. HEENT   Head: Normocephalic and atraumatic.      Eyes: Conjunctivae are normal. PERRL. Normal extraocular movements.      Ears:         Nose: No congestion/rhinnorhea.   Mouth/Throat: Mucous membranes are moist.   Neck: No stridor. Cardiovascular/Chest: Normal rate, regular rhythm.  No murmurs, rubs, or gallops. Respiratory: Normal respiratory effort without tachypnea nor retractions. Breath sounds are clear and equal bilaterally. No wheezes/rales/rhonchi. Gastrointestinal: Soft. No distention, no guarding, no rebound. Moderate tenderness in the mid abdomen.  No right upper quadrant tenderness to palpation. Genitourinary/rectal:Deferred Musculoskeletal: Nontender with normal range of motion in all extremities. No joint effusions.  No lower extremity tenderness.  No edema. Neurologic:  Normal speech and language. No gross or focal neurologic deficits are appreciated. Skin:  Skin is warm, dry and intact. No rash noted. Psychiatric: Mood and affect are  normal. Speech and behavior are normal. Patient exhibits appropriate insight and judgment.   ____________________________________________  LABS (pertinent positives/negatives)  Labs Reviewed  COMPREHENSIVE METABOLIC PANEL - Abnormal; Notable for the following:       Result Value   Glucose, Bld 111 (*)    Total Protein 8.2 (*)    All other components within normal limits  CBC - Abnormal; Notable for the following:    WBC 15.5 (*)    All other components within normal limits  URINALYSIS  COMPLETEWITH MICROSCOPIC ([facility] ONLY) - Abnormal; Notable for the following:    Color, Urine YELLOW (*)    APPearance CLEAR (*)    Glucose, UA 50 (*)    Squamous Epithelial / LPF 0-5 (*)    All other components within normal limits  LIPASE, BLOOD    ____________________________________________    EKG I, Governor Rooks, MD, the attending physician have personally viewed and interpreted all ECGs.  70 bpm. Normal sinus rhythm. Narrow QRS. Normal axis. Normal ST and T-wave ____________________________________________  RADIOLOGY All Xrays were viewed by me. Imaging interpreted by Radiologist.  CT abdomen and pelvis with contrast:  IMPRESSION: No acute finding to account for the patient's symptoms.  Moderate to severe stool burden with no evidence of obstruction.  Diverticular disease without evidence of acute diverticulitis.  Atherosclerosis. __________________________________________  PROCEDURES  Procedure(s) performed: None  Critical Care performed: None  ____________________________________________   ED COURSE / ASSESSMENT AND PLAN  Pertinent labs & imaging results that were available during my care of the patient were reviewed by me and considered in my medical decision making (see chart for details).   Mr. Gargis came here due to the waxing and waning abdominal pain, without a history of prior episodes. His white blood cell count was elevated, I discussed with him recommendation of CT scan for further evaluation.  CT scan shows no significant concerning findings to explain pain or elevated white blood cell count. He does have moderate stool burden, and we discussed management for stool softener.  Uncertain etiology for the mildly elevated white blood cell count, but I do think the patient is okay for discharge and outpatient follow-up. I referred to [facility] clinic. None    CONSULTATIONS:   None   Patient / Family / Caregiver informed of clinical course, medical  decision-making process, and agree with plan.   I discussed return precautions, follow-up instructions, and discharge instructions with patient and/or family.   ___________________________________________   FINAL CLINICAL IMPRESSION(S) / ED DIAGNOSES   Final diagnoses:  Periumbilical abdominal pain              Note: This dictation was prepared with Dragon dictation. Any transcriptional errors that result from this process are unintentional    Governor Rooks, MD 03/22/16 1423

## 2016-03-22 NOTE — ED Notes (Signed)
Ems attempted iv stick x 1, shannin rn attempted iv stick x 1 and I attempted iv stick x 1 unsuccessfully.

## 2016-03-22 NOTE — ED Triage Notes (Signed)
Pt to ed with c/o abd pain that started this am around 0430.  Pt states he felt like he had to have a bm but was only partially successful.  Pt denies nausea and vomiting.

## 2016-05-26 ENCOUNTER — Emergency Department
Admission: EM | Admit: 2016-05-26 | Discharge: 2016-05-26 | Disposition: A | Discharge status: Home / Self Care | Payer: No Typology Code available for payment source | Attending: Emergency Medicine | Admitting: Emergency Medicine

## 2016-05-26 DIAGNOSIS — Y990 Civilian activity done for income or pay: Secondary | ICD-10-CM | POA: Diagnosis not present

## 2016-05-26 DIAGNOSIS — Y9389 Activity, other specified: Secondary | ICD-10-CM | POA: Diagnosis not present

## 2016-05-26 DIAGNOSIS — S0591XA Unspecified injury of right eye and orbit, initial encounter: Secondary | ICD-10-CM | POA: Diagnosis present

## 2016-05-26 DIAGNOSIS — Y929 Unspecified place or not applicable: Secondary | ICD-10-CM | POA: Diagnosis not present

## 2016-05-26 DIAGNOSIS — S0501XA Injury of conjunctiva and corneal abrasion without foreign body, right eye, initial encounter: Secondary | ICD-10-CM | POA: Diagnosis not present

## 2016-05-26 DIAGNOSIS — F17200 Nicotine dependence, unspecified, uncomplicated: Secondary | ICD-10-CM | POA: Insufficient documentation

## 2016-05-26 DIAGNOSIS — Z91040 Latex allergy status: Secondary | ICD-10-CM | POA: Diagnosis not present

## 2016-05-26 DIAGNOSIS — X58XXXA Exposure to other specified factors, initial encounter: Secondary | ICD-10-CM | POA: Diagnosis not present

## 2016-05-26 MED ORDER — FLUORESCEIN SODIUM 1 MG OP STRP
ORAL_STRIP | OPHTHALMIC | Status: AC
Start: 2016-05-26 — End: 2016-05-26
  Administered 2016-05-26: 1 via OPHTHALMIC
  Filled 2016-05-26: qty 1

## 2016-05-26 MED ORDER — NAPROXEN 500 MG PO TBEC
500.0000 mg | DELAYED_RELEASE_TABLET | Freq: Two times a day (BID) | ORAL | 0 refills | Status: AC
Start: 2016-05-26 — End: 2017-05-26

## 2016-05-26 MED ORDER — FLUORESCEIN SODIUM 0.6 MG OP STRP
1.0000 | ORAL_STRIP | Freq: Once | OPHTHALMIC | Status: AC
  Administered 2016-05-26: 1 via OPHTHALMIC

## 2016-05-26 MED ORDER — CIPROFLOXACIN HCL 0.3 % OP SOLN: 2.0000 [drp] | OPHTHALMIC | 0 refills | Status: AC

## 2016-05-26 MED ORDER — TETRACAINE HCL 0.5 % OP SOLN
OPHTHALMIC | Status: AC
  Administered 2016-05-26: 16:00:00
  Filled 2016-05-26: qty 2

## 2016-05-26 NOTE — ED Triage Notes (Signed)
Pt c/o pain and drainage from the right eye for the past 3-4 days.

## 2016-05-26 NOTE — ED Notes (Signed)
Pt verbalized understanding of discharge instructions. NAD at this time. 

## 2016-05-26 NOTE — ED Provider Notes (Signed)
[facility] Emergency Department Provider Note  ____________________________________________   First MD Initiated Contact with Patient 05/26/16 1422     (approximate)  I have reviewed the triage vital signs and the nursing notes.   HISTORY  Chief Complaint Eye Drainage    HPI Samuel Dixon is a 50 y.o. male male presenting with right eye pain, photophobia and blurry vision for the past 3-4 days. He is accompanied by his step-son. He works as a Estate agentforklift operator. He has noticed right eye watering and crusting. He denies fever. He denies prior trauma to the right eye or foreign body involvement. Triage arrival comment noted. He does not wear contacts or glasses. Patient rates his pain at 0 out of 10. He denies attempting alleviating factors. He has not been around people with similar symptoms.   History reviewed. No pertinent past medical history.  There are no active problems to display for this patient.   Past Surgical History:  Procedure Laterality Date  . APPENDECTOMY      Prior to Admission medications   Medication Sig Start Date End Date Taking? Authorizing Provider  ciprofloxacin (CILOXAN) 0.3 % ophthalmic solution Place 2 drops into the right eye every 4 (four) hours while awake. Administer 1 drop, every 2 hours, while awake, for 2 days. Then 1 drop, every 4 hours, while awake, for the next 5 days. 05/26/16 05/31/16  Dayna BarkerJaclyn M Adryana Mogensen, PA-C  LORATADINE PO Take by mouth.    Historical Provider, MD  naproxen (EC NAPROSYN) 500 MG EC tablet Take 1 tablet (500 mg total) by mouth 2 (two) times daily with a meal. 05/26/16 05/26/17  Orvil FeilJaclyn M Janalyn Higby, PA-C    Allergies Latex; Penicillins; and Sulfa antibiotics  No family history on file.  Social History Social History  Substance Use Topics  . Smoking status: Current Every Day Smoker  . Smokeless tobacco: Never Used  . Alcohol use No    Review of Systems Constitutional: No fever/chills Eyes: Has  right eye pain, photophobia and blurry vision. ENT: No sore throat. Cardiovascular: Denies chest pain. Respiratory: Denies shortness of breath. Gastrointestinal: No abdominal pain.  No nausea, no vomiting.  No diarrhea.  No constipation. Skin: Negative for rash. Neurological: Negative for headaches, focal weakness or numbness.  10-point ROS otherwise negative.  ____________________________________________   PHYSICAL EXAM:  VITAL SIGNS: ED Triage Vitals  Enc Vitals Group     BP 05/26/16 1258 127/82     Pulse Rate 05/26/16 1258 (!) 52     Resp 05/26/16 1258 16     Temp 05/26/16 1258 98.4 F (36.9 C)     Temp Source 05/26/16 1258 Oral     SpO2 05/26/16 1258 95 %     Weight 05/26/16 1258 212 lb (96.2 kg)     Height 05/26/16 1258 5\' 5"  (1.651 m)     Head Circumference --      Peak Flow --      Pain Score 05/26/16 1259 5     Pain Loc --      Pain Edu? --      Excl. in GC? --    Constitutional: Alert and oriented. Well appearing and in no acute distress. Eyes: Extraocular eye muscles are intact bilaterally. Patient's right eye pain is exacerbated with extraocular eye muscle movement. Pupils are equal round and reactive to light bilaterally. Accommodation within reference range. No perilimbal erythema is visualized, left. Patient's left eye is watering throughout exam. Upon fluorescein stain examination, fluorescein uptake was  noted at the inferior-medial aspect of the right cornea. Head: Atraumatic. Cardiovascular: Normal rate, regular rhythm. Grossly normal heart sounds.  Good peripheral circulation. Respiratory: Normal respiratory effort.  No retractions. Lungs CTAB. Skin:  Skin is warm, dry and intact. No erythema or edema of the skin overlying the left inferior or superior orbit.  Psychiatric: Mood and affect are normal. Speech and behavior are normal. ____________________________________________   LABS (all labs ordered are listed, but only abnormal results are  displayed)  Labs Reviewed - No data to display ____________________________________________   Procedures Fluorescein Staining  Tetracaine Eye drops   ____________________________________________   INITIAL IMPRESSION / ASSESSMENT AND PLAN / ED COURSE  Pertinent labs & imaging results that were available during my care of the patient were reviewed by me and considered in my medical decision making (see chart for details).   Clinical Course    Assessment and plan: Right corneal abrasion Patient has right eye pain for the past 3 days. Patient had fluorescein uptake on physical exam, consistent with a corneal abrasion. No periimbal redness or erythema of the sclera which would suggest a deeper eye infection. Ciprofloxacin optic solution was prescribed. A referral was made to the ophthalmologist on-call, Dr. Sherlyn LeesVin-Parikh. I advised patient to make an appointment for Monday. All patient questions were answered. Patient's vitals are reassuring at this time. Strict return precautions were given.  ____________________________________________   FINAL CLINICAL IMPRESSION(S) / ED DIAGNOSES  Final diagnoses:  Abrasion of right cornea, initial encounter      NEW MEDICATIONS STARTED DURING THIS VISIT:  Discharge Medication List as of 05/26/2016  3:28 PM    START taking these medications   Details  ciprofloxacin (CILOXAN) 0.3 % ophthalmic solution Place 2 drops into the right eye every 4 (four) hours while awake. Administer 1 drop, every 2 hours, while awake, for 2 days. Then 1 drop, every 4 hours, while awake, for the next 5 days., Starting Sun 05/26/2016, Until Fri 05/31/2016, Print    naproxen (EC NAPROSYN) 500 MG EC tablet Take 1 tablet (500 mg total) by mouth 2 (two) times daily with a meal., Starting Sun 05/26/2016, Until Mon 05/26/2017, Print         Note:  This document was prepared using Dragon voice recognition software and may include unintentional dictation errors.     Orvil FeilJaclyn M Illona Bulman, PA-C 05/26/16 1720    Sharyn CreamerMark Quale, MD 06/02/16 [phone]

## 2017-01-13 ENCOUNTER — Emergency Department
Admission: EM | Admit: 2017-01-13 | Discharge: 2017-01-13 | Disposition: A | Discharge status: Home / Self Care | Payer: No Typology Code available for payment source | Attending: Emergency Medicine | Admitting: Emergency Medicine

## 2017-01-13 DIAGNOSIS — Z79899 Other long term (current) drug therapy: Secondary | ICD-10-CM | POA: Insufficient documentation

## 2017-01-13 DIAGNOSIS — Z91040 Latex allergy status: Secondary | ICD-10-CM | POA: Diagnosis not present

## 2017-01-13 DIAGNOSIS — R0981 Nasal congestion: Secondary | ICD-10-CM | POA: Diagnosis not present

## 2017-01-13 DIAGNOSIS — J208 Acute bronchitis due to other specified organisms: Secondary | ICD-10-CM | POA: Insufficient documentation

## 2017-01-13 DIAGNOSIS — F17210 Nicotine dependence, cigarettes, uncomplicated: Secondary | ICD-10-CM | POA: Diagnosis not present

## 2017-01-13 DIAGNOSIS — R05 Cough: Secondary | ICD-10-CM | POA: Diagnosis present

## 2017-01-13 MED ORDER — AZITHROMYCIN 250 MG PO TABS: ORAL_TABLET | ORAL | 0 refills | Status: AC

## 2017-01-13 MED ORDER — FEXOFENADINE-PSEUDOEPHED ER 60-120 MG PO TB12: 1.0000 | ORAL_TABLET | Freq: Two times a day (BID) | ORAL | 0 refills | Status: DC

## 2017-01-13 MED ORDER — BENZONATATE 100 MG PO CAPS: 200.0000 mg | ORAL_CAPSULE | Freq: Three times a day (TID) | ORAL | 0 refills | Status: AC | PRN

## 2017-01-13 NOTE — ED Provider Notes (Signed)
[facility] Emergency Department Provider Note  ____________________________________________   First MD Initiated Contact with Patient 01/13/17 1115     (approximate)  I have reviewed the triage vital signs and the nursing notes.   HISTORY  Chief Complaint Cough    HPI Samuel Dixon is a 51 y.o. male patient complaining of nasal congestion, ear pressure, postnasal drainage and nonproductive cough for 2 days. Patient state fever/chills and body aches. Patient rates his pain as a 7/10. Patient describes pain as "achy". No palliative measures for complaint.  History reviewed. No pertinent past medical history.  There are no active problems to display for this patient.   Past Surgical History:  Procedure Laterality Date  . APPENDECTOMY      Prior to Admission medications   Medication Sig Start Date End Date Taking? Authorizing Provider  azithromycin (ZITHROMAX Z-PAK) 250 MG tablet Take 2 tablets (500 mg) on  Day 1,  followed by 1 tablet (250 mg) once daily on Days 2 through 5. 01/13/17 01/18/17  Joni ReiningSmith, Saranya Harlin K, PA-C  benzonatate (TESSALON PERLES) 100 MG capsule Take 2 capsules (200 mg total) by mouth 3 (three) times daily as needed for cough. 01/13/17 01/13/18  Joni ReiningSmith, Patriciaann Rabanal K, PA-C  fexofenadine-pseudoephedrine (ALLEGRA-D) 60-120 MG 12 hr tablet Take 1 tablet by mouth 2 (two) times daily. 01/13/17   Joni ReiningSmith, Donalyn Schneeberger K, PA-C  LORATADINE PO Take by mouth.    [provider]  naproxen (EC NAPROSYN) 500 MG EC tablet Take 1 tablet (500 mg total) by mouth 2 (two) times daily with a meal. 05/26/16 05/26/17  Orvil FeilWoods, Jaclyn M, PA-C    Allergies Latex; Penicillins; and Sulfa antibiotics  No family history on file.  Social History Social History  Substance Use Topics  . Smoking status: Current Every Day Smoker  . Smokeless tobacco: Never Used  . Alcohol use No    Review of Systems Constitutional: No fever/chills Eyes: No visual changes. ENT: No sore  throat. Nasal congestion and ear pressure. Cardiovascular: Denies chest pain. Respiratory: Denies shortness of breath. Nonproductive cough. Gastrointestinal: No abdominal pain.  No nausea, no vomiting.  No diarrhea.  No constipation. Genitourinary: Negative for dysuria. Musculoskeletal: Negative for back pain. Skin: Negative for rash. Neurological: Negative for headaches, focal weakness or numbness. Allergic/Immunilogical: See medication list ____________________________________________   PHYSICAL EXAM:  VITAL SIGNS: ED Triage Vitals  Enc Vitals Group     BP 01/13/17 1025 131/78     Pulse Rate 01/13/17 1025 98     Resp 01/13/17 1025 18     Temp 01/13/17 1025 98.2 F (36.8 C)     Temp Source 01/13/17 1025 Oral     SpO2 01/13/17 1025 99 %     Weight --      Height --      Head Circumference --      Peak Flow --      Pain Score 01/13/17 0958 7     Pain Loc --      Pain Edu? --      Excl. in GC? --     Constitutional: Alert and oriented. Well appearing and in no acute distress. Nose:Edematous nasal turbinates. Mouth/Throat: Mucous membranes are moist.  Oropharynx non-erythematous. Neck: No stridor.   Hematological/Lymphatic/Immunilogical: No cervical lymphadenopathy. Cardiovascular: Normal rate, regular rhythm. Grossly normal heart sounds.  Good peripheral circulation. Respiratory: Normal respiratory effort.  No retractions. Lungs CTAB. Neurologic:  Normal speech and language. No gross focal neurologic deficits are appreciated. No gait instability.  Skin:  Skin is warm, dry and intact. No rash noted. Psychiatric: Mood and affect are normal. Speech and behavior are normal.  ____________________________________________   LABS (all labs ordered are listed, but only abnormal results are displayed)  Labs Reviewed - No data to display ____________________________________________  EKG   ____________________________________________  RADIOLOGY  No results  found.  ____________________________________________   PROCEDURES  Procedure(s) performed: None  Procedures  Critical Care performed: No  ____________________________________________   INITIAL IMPRESSION / ASSESSMENT AND PLAN / ED COURSE  Pertinent labs & imaging results that were available during my care of the patient were reviewed by me and considered in my medical decision making (see chart for details).  Bronchitis. Patient given discharge care instruction. His prostate medication as directed. Patient advised [facility]-up with [facility] if condition persists.      ____________________________________________   FINAL CLINICAL IMPRESSION(S) / ED DIAGNOSES  Final diagnoses:  Acute bronchitis due to other specified organisms      NEW MEDICATIONS STARTED DURING THIS VISIT:  New Prescriptions   AZITHROMYCIN (ZITHROMAX Z-PAK) 250 MG TABLET    Take 2 tablets (500 mg) on  Day 1,  followed by 1 tablet (250 mg) once daily on Days 2 through 5.   BENZONATATE (TESSALON PERLES) 100 MG CAPSULE    Take 2 capsules (200 mg total) by mouth 3 (three) times daily as needed for cough.   FEXOFENADINE-PSEUDOEPHEDRINE (ALLEGRA-D) 60-120 MG 12 HR TABLET    Take 1 tablet by mouth 2 (two) times daily.     Note:  This document was prepared using Dragon voice recognition software and may include unintentional dictation errors.    Joni ReiningSmith, Zakery Normington K, PA-C 01/13/17 1133    Emily FilbertWilliams, Jonathan E, MD 01/13/17 [phone]

## 2017-01-13 NOTE — ED Triage Notes (Signed)
Pt c/o cough with congestion and HA for the past 2 days.

## 2017-01-13 NOTE — ED Notes (Signed)
See triage note  States he developed a few days ago  Unsure of fever  But afebrile on arrival

## 2017-12-09 IMAGING — DX DG ANKLE COMPLETE 3+V*R*
3 series · 3 of 3 positions shown · non-contrast
Comparison: None.

CLINICAL DATA: Patient c/o laceration to the right ankle. Tailgate
to a trailer fell open and landed down his ankle. Initial encounter.

EXAM:
RIGHT ANKLE - COMPLETE 3+ VIEW

[ankle ap]
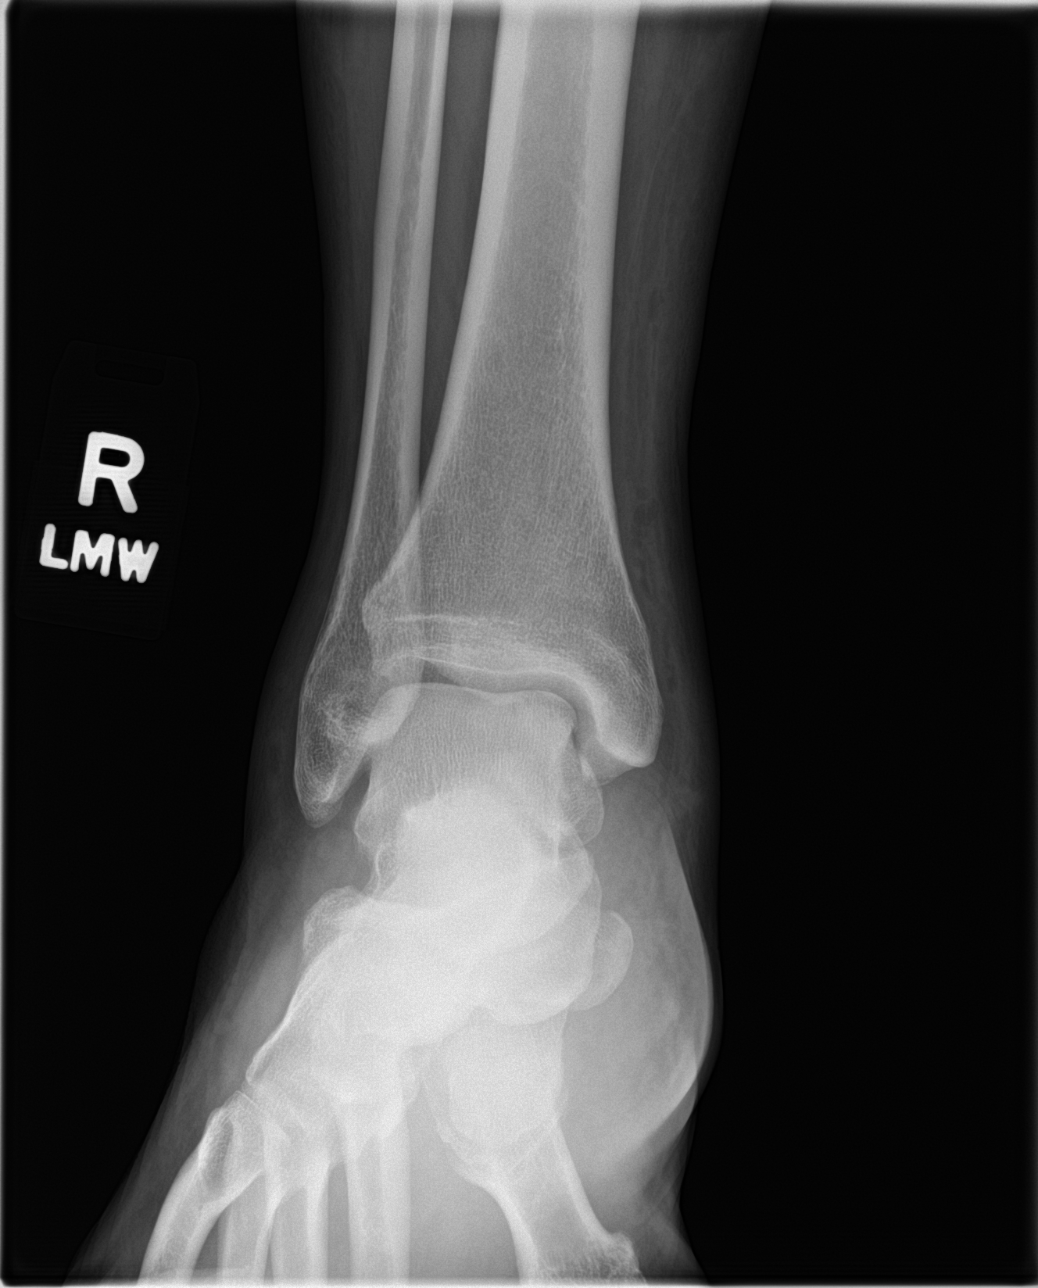

[ankle obl]
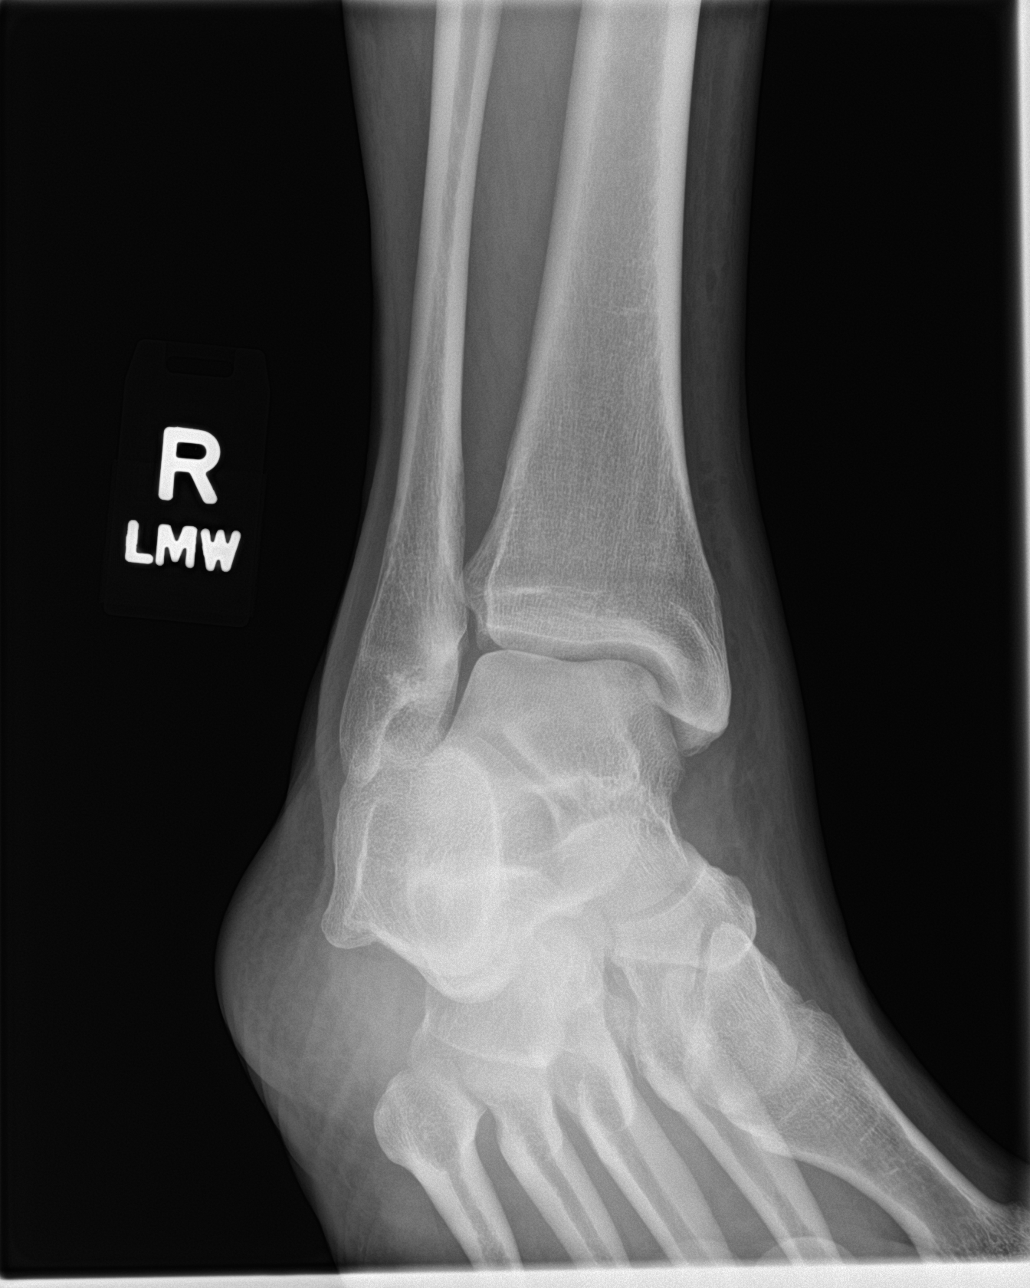

[ankle lat]
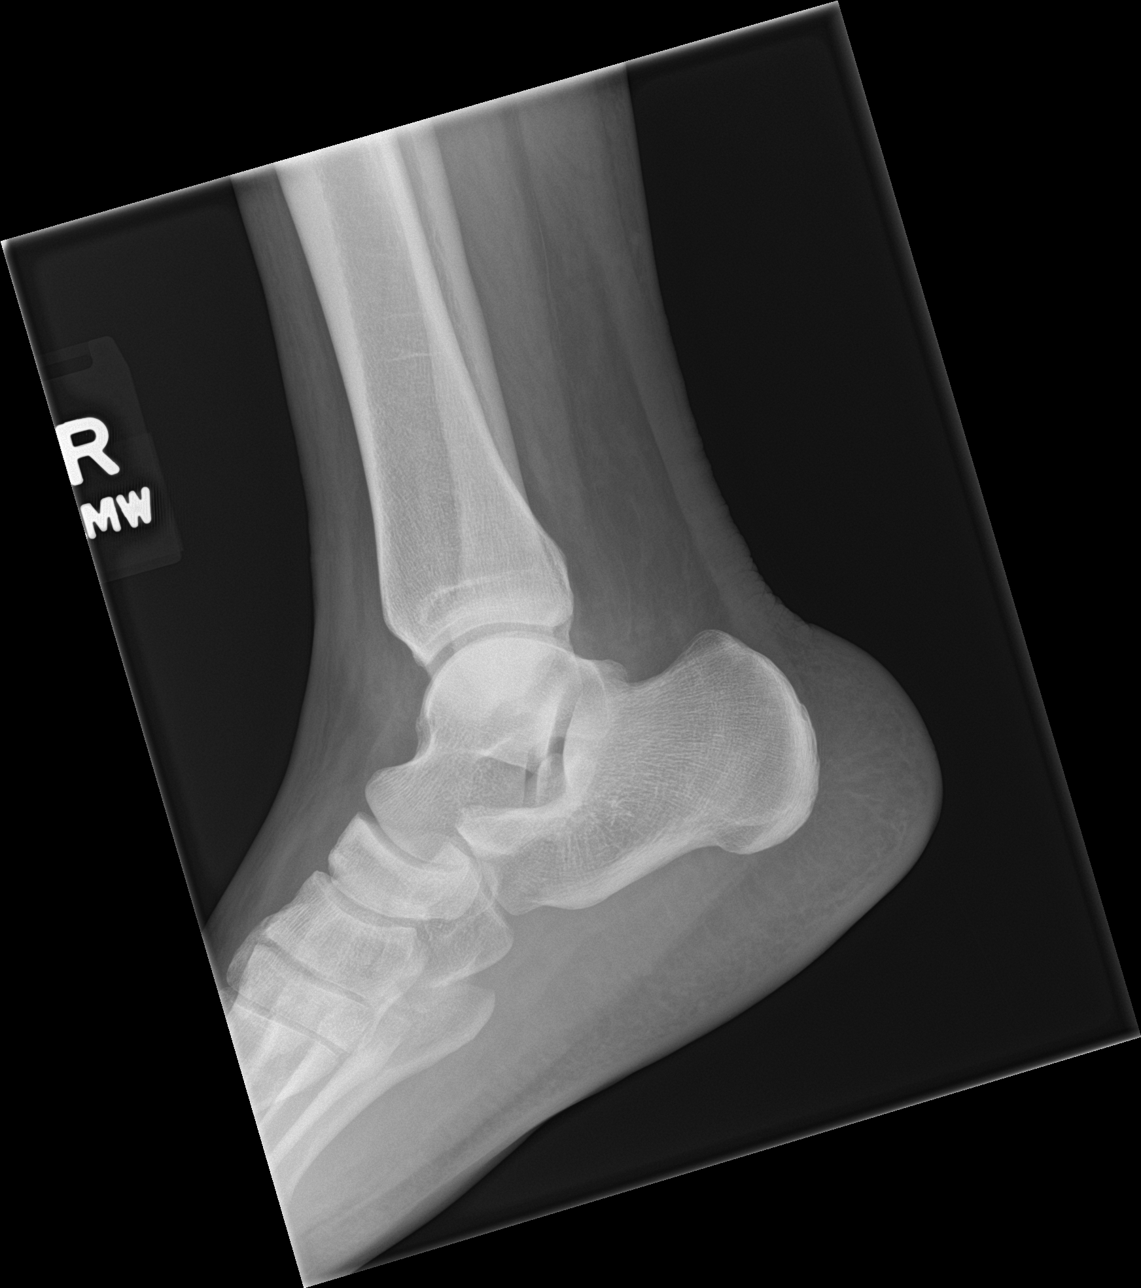

[3 of 3 positions shown; findings below may reference images not displayed]

FINDINGS: Normal anatomic alignment. No evidence for acute fracture or
dislocation. Soft tissue swelling and gas within the soft tissues
overlying the distal tibia.
IMPRESSION: Swelling and gas within the soft tissues overlying the distal tibia
most compatible with soft tissue injury.

No acute osseous abnormality.

## 2019-09-13 ENCOUNTER — Ambulatory Visit: Payer: No Typology Code available for payment source | Attending: Internal Medicine

## 2019-09-17 ENCOUNTER — Ambulatory Visit: Payer: No Typology Code available for payment source | Attending: Internal Medicine

## 2019-09-17 DIAGNOSIS — Z23 Encounter for immunization: Secondary | ICD-10-CM

## 2019-09-17 NOTE — Progress Notes (Signed)
   Covid-19 Vaccination Clinic  Name:  Samuel Dixon    MRN: [id number] DOB: 10-30-1965  09/17/2019  Mr. Samuel Dixon was observed post Covid-19 immunization for 15 minutes without incident. He was provided with Vaccine Information Sheet and instruction to access the V-Safe system.   Mr. Samuel Dixon was instructed to call 911 with any severe reactions post vaccine: Mar[place] Kitchen Difficulty breathing  . Swelling of face and throat  . A fast heartbeat  . A bad rash all over body  . Dizziness and weakness   Immunizations Administered    Name Date Dose VIS Date Route   Pfizer COVID-19 Vaccine 09/17/2019  4:37 PM 0.3 mL 05/28/2019 Intramuscular   Manufacturer: [employer], [employer]   Lot: [id number]   NDC: [address]-3

## 2019-10-08 ENCOUNTER — Ambulatory Visit: Payer: No Typology Code available for payment source | Attending: Internal Medicine

## 2019-10-08 DIAGNOSIS — Z23 Encounter for immunization: Secondary | ICD-10-CM

## 2019-10-08 NOTE — Progress Notes (Signed)
   Covid-19 Vaccination Clinic  Name:  Samuel Dixon    MRN: [id number] DOB: 01-13-1966  10/08/2019  [name] was observed post Covid-19 immunization for 15 minutes without incident. He was provided with Vaccine Information Sheet and instruction to access the V-Safe system.   [name] was instructed to call 911 with any severe reactions post vaccine: Marland Kitchen Difficulty breathing  . Swelling of face and throat  . A fast heartbeat  . A bad rash all over body  . Dizziness and weakness   Immunizations Administered    Name Date Dose VIS Date Route   Pfizer COVID-19 Vaccine 10/08/2019  3:38 PM 0.3 mL 08/11/2018 Intramuscular   Manufacturer: [employer], [employer]   Lot: [id number]   NDC: [address]-8

## 2020-11-16 ENCOUNTER — Other Ambulatory Visit: Payer: Self-pay

## 2020-11-16 ENCOUNTER — Emergency Department
Admission: EM | Admit: 2020-11-16 | Discharge: 2020-11-16 | Disposition: A | Discharge status: Home / Self Care | Payer: BC Managed Care – PPO | Attending: Emergency Medicine | Admitting: Emergency Medicine

## 2020-11-16 ENCOUNTER — Emergency Department: Payer: BC Managed Care – PPO

## 2020-11-16 DIAGNOSIS — Z91040 Latex allergy status: Secondary | ICD-10-CM | POA: Insufficient documentation

## 2020-11-16 DIAGNOSIS — F17200 Nicotine dependence, unspecified, uncomplicated: Secondary | ICD-10-CM | POA: Diagnosis not present

## 2020-11-16 DIAGNOSIS — M79604 Pain in right leg: Secondary | ICD-10-CM | POA: Insufficient documentation

## 2020-11-16 NOTE — ED Provider Notes (Signed)
[facility]-EMERGENCY DEPARTMENT  ____________________________________________  Time seen: Approximately 7:40 PM  I have reviewed the triage vital signs and the nursing notes.   HISTORY  Chief Complaint Leg Pain   Historian Patient     HPI Samuel Dixon is a 55 y.o. male presents to the emergency department with right calf pain.  Patient states that he was at work and turned and felt immediate pain in his calf.  He has been keeping his right calf covered with an Ace wrap and has been taking a muscle relaxer which he states has been helping with the pain.  He was referred to the emergency department for DVT rule out by [facility].  He denies recent travel, prolonged immobilization, recent surgery, chest pain, chest tightness or shortness of breath.   No past medical history on file.   Immunizations up to date:  Yes.     No past medical history on file.  There are no problems to display for this patient.   Past Surgical History:  Procedure Laterality Date  . APPENDECTOMY      Prior to Admission medications   Medication Sig Start Date End Date Taking? Authorizing Provider  fexofenadine-pseudoephedrine (ALLEGRA-D) 60-120 MG 12 hr tablet Take 1 tablet by mouth 2 (two) times daily. 01/13/17   Joni Reining, PA-C  LORATADINE PO Take by mouth.    [provider]    Allergies Latex, Penicillins, and Sulfa antibiotics  No family history on file.  Social History Social History   Tobacco Use  . Smoking status: Current Every Day Smoker  . Smokeless tobacco: Never Used  Substance Use Topics  . Alcohol use: No  . Drug use: No     Review of Systems  Constitutional: No fever/chills Eyes:  No discharge ENT: No upper respiratory complaints. Respiratory: no cough. No SOB/ use of accessory muscles to breath Gastrointestinal:   No nausea, no vomiting.  No diarrhea.  No constipation. Musculoskeletal: Patient has right calf pain.  Skin: Negative for rash,  abrasions, lacerations, ecchymosis.    ____________________________________________   PHYSICAL EXAM:  VITAL SIGNS: ED Triage Vitals  Enc Vitals Group     BP 11/16/20 1659 (!) 140/94     Pulse Rate 11/16/20 1658 83     Resp 11/16/20 1658 20     Temp 11/16/20 1658 99 F (37.2 C)     Temp Source 11/16/20 1658 Oral     SpO2 11/16/20 1658 97 %     Weight 11/16/20 1659 232 lb (105.2 kg)     Height 11/16/20 1659 5\' 4"  (1.626 m)     Head Circumference --      Peak Flow --      Pain Score 11/16/20 1659 8     Pain Loc --      Pain Edu? --      Excl. in GC? --      Constitutional: Alert and oriented. Well appearing and in no acute distress. Eyes: Conjunctivae are normal. PERRL. EOMI. Head: Atraumatic. ENT:      Nose: No congestion/rhinnorhea.      Mouth/Throat: Mucous membranes are moist.  Neck: No stridor.  No cervical spine tenderness to palpation. Cardiovascular: Normal rate, regular rhythm. Normal S1 and S2.  Good peripheral circulation. Respiratory: Normal respiratory effort without tachypnea or retractions. Lungs CTAB. Good air entry to the bases with no decreased or absent breath sounds Gastrointestinal: Bowel sounds x 4 quadrants. Soft and nontender to palpation. No guarding or rigidity. No distention. Musculoskeletal:  Full range of motion to all extremities. No obvious deformities noted.  Patient has right calf pain to palpation.  No overlying erythema.  Palpable dorsalis pedis pulse, right. Neurologic:  Normal for age. No gross focal neurologic deficits are appreciated.  Skin:  Skin is warm, dry and intact. No rash noted. Psychiatric: Mood and affect are normal for age. Speech and behavior are normal.   ____________________________________________   LABS (all labs ordered are listed, but only abnormal results are displayed)  Labs Reviewed - No data to  display ____________________________________________  EKG   ____________________________________________  RADIOLOGY Geraldo Pitter, personally viewed and evaluated these images (plain radiographs) as part of my medical decision making, as well as reviewing the written report by the radiologist.  US Venous Img Lower Unilateral Right  Result Date: 11/16/2020 CLINICAL DATA:  Right lower extremity edema and pain, initial encounter EXAM: RIGHT LOWER EXTREMITY VENOUS DOPPLER ULTRASOUND TECHNIQUE: Gray-scale sonography with graded compression, as well as color Doppler and duplex ultrasound were performed to evaluate the lower extremity deep venous systems from the level of the common femoral vein and including the common femoral, femoral, profunda femoral, popliteal and calf veins including the posterior tibial, peroneal and gastrocnemius veins when visible. The superficial great saphenous vein was also interrogated. Spectral Doppler was utilized to evaluate flow at rest and with distal augmentation maneuvers in the common femoral, femoral and popliteal veins. COMPARISON:  None. FINDINGS: Contralateral Common Femoral Vein: Respiratory phasicity is normal and symmetric with the symptomatic side. No evidence of thrombus. Normal compressibility. Common Femoral Vein: No evidence of thrombus. Normal compressibility, respiratory phasicity and response to augmentation. Saphenofemoral Junction: No evidence of thrombus. Normal compressibility and flow on color Doppler imaging. Profunda Femoral Vein: No evidence of thrombus. Normal compressibility and flow on color Doppler imaging. Femoral Vein: No evidence of thrombus. Normal compressibility, respiratory phasicity and response to augmentation. Popliteal Vein: No evidence of thrombus. Normal compressibility, respiratory phasicity and response to augmentation. Calf Veins: No evidence of thrombus. Normal compressibility and flow on color Doppler imaging. Superficial  Great Saphenous Vein: No evidence of thrombus. Normal compressibility. Venous Reflux:  None. Other Findings:  None. IMPRESSION: No evidence of deep venous thrombosis. Electronically Signed   By: Alcide Clever M.D.   On: 11/16/2020 18:57    ____________________________________________    PROCEDURES  Procedure(s) performed:     Procedures     Medications - No data to display   ____________________________________________   INITIAL IMPRESSION / ASSESSMENT AND PLAN / ED COURSE  Pertinent labs & imaging results that were available during my care of the patient were reviewed by me and considered in my medical decision making (see chart for details).      Assessment and plan Calf pain 55 year old male presents to the emergency department with right calf pain that is reproducible with palpation.  No palpable deficit over the insertion for the Achilles tendon.  No evidence of DVT on venous ultrasound.  Patient was advised to continue using Ace wrap and to apply ice and to keep right lower extremity elevated at night.  He was given a referral to orthopedics.  Patient reports that he cannot tolerate NSAIDs and was advised to continue taking muscle relaxer as directed.  Return precautions were given to return with new or worsening symptoms.  All patient questions were answered.     ____________________________________________  FINAL CLINICAL IMPRESSION(S) / ED DIAGNOSES  Final diagnoses:  Right leg pain      NEW MEDICATIONS STARTED  DURING THIS VISIT:  ED Discharge Orders    None          This chart was dictated using voice recognition software/Dragon. Despite best efforts to proofread, errors can occur which can change the meaning. Any change was purely unintentional.     Orvil Feil, PA-C 11/16/20 1942    Phineas Semen, MD 11/16/20 2005

## 2020-11-16 NOTE — ED Triage Notes (Signed)
Pt was working this past Monday and states he moved funny and felt pain in right calf. Pt was seen at [facility] and was sent to ED for ultrasound. Pt with mildly swollen right calf and pt states pain with walking. Pt states pain is in back of calf and comes to front.

## 2020-11-16 NOTE — Discharge Instructions (Signed)
Continue taking muscle relaxer as indicated. Apply ice at home.

## 2022-01-18 ENCOUNTER — Encounter
Admission: EM | Disposition: A | Discharge status: Home / Self Care | Payer: Self-pay | Source: Home / Self Care | Attending: Hospitalist

## 2022-01-18 ENCOUNTER — Inpatient Hospital Stay
Admission: EM | Admit: 2022-01-18 | Discharge: 2022-01-21 | DRG: 247 | Disposition: A | Discharge status: Home / Self Care | Payer: BC Managed Care – PPO | Attending: Hospitalist | Admitting: Hospitalist

## 2022-01-18 ENCOUNTER — Emergency Department: Payer: BC Managed Care – PPO

## 2022-01-18 ENCOUNTER — Encounter: Payer: Self-pay | Admitting: Emergency Medicine

## 2022-01-18 DIAGNOSIS — L258 Unspecified contact dermatitis due to other agents: Secondary | ICD-10-CM | POA: Diagnosis not present

## 2022-01-18 DIAGNOSIS — I5032 Chronic diastolic (congestive) heart failure: Secondary | ICD-10-CM | POA: Diagnosis present

## 2022-01-18 DIAGNOSIS — Z6838 Body mass index (BMI) 38.0-38.9, adult: Secondary | ICD-10-CM

## 2022-01-18 DIAGNOSIS — E669 Obesity, unspecified: Secondary | ICD-10-CM | POA: Diagnosis present

## 2022-01-18 DIAGNOSIS — F17200 Nicotine dependence, unspecified, uncomplicated: Secondary | ICD-10-CM | POA: Diagnosis present

## 2022-01-18 DIAGNOSIS — Z9889 Other specified postprocedural states: Secondary | ICD-10-CM

## 2022-01-18 DIAGNOSIS — Z20822 Contact with and (suspected) exposure to covid-19: Secondary | ICD-10-CM | POA: Diagnosis present

## 2022-01-18 DIAGNOSIS — I2111 ST elevation (STEMI) myocardial infarction involving right coronary artery: Secondary | ICD-10-CM | POA: Diagnosis present

## 2022-01-18 DIAGNOSIS — E785 Hyperlipidemia, unspecified: Secondary | ICD-10-CM | POA: Diagnosis present

## 2022-01-18 DIAGNOSIS — Z8249 Family history of ischemic heart disease and other diseases of the circulatory system: Secondary | ICD-10-CM | POA: Diagnosis not present

## 2022-01-18 DIAGNOSIS — R0789 Other chest pain: Secondary | ICD-10-CM | POA: Diagnosis present

## 2022-01-18 DIAGNOSIS — I213 ST elevation (STEMI) myocardial infarction of unspecified site: Principal | ICD-10-CM

## 2022-01-18 HISTORY — PX: LEFT HEART CATH AND CORONARY ANGIOGRAPHY: CATH118249

## 2022-01-18 HISTORY — PX: CORONARY/GRAFT ACUTE MI REVASCULARIZATION: CATH118305

## 2022-01-18 LAB — TROPONIN I (HIGH SENSITIVITY)
Troponin I (High Sensitivity): 11 ng/L (ref <18)
Troponin I (High Sensitivity): 15018 ng/L (ref <18)

## 2022-01-18 LAB — COMPREHENSIVE METABOLIC PANEL
ALT: 17 U/L (ref 0–44)
AST: 19 U/L (ref 15–41)
Albumin: 3.7 g/dL (ref 3.5–5.0)
Alkaline Phosphatase: 80 U/L (ref 38–126)
Anion gap: 6 (ref 5–15)
BUN: 16 mg/dL (ref 6–20)
CO2: 24 mmol/L (ref 22–32)
Calcium: 8.9 mg/dL (ref 8.9–10.3)
Chloride: 109 mmol/L (ref 98–111)
Creatinine, Ser: 1.23 mg/dL (ref 0.61–1.24)
GFR, Estimated: >60 mL/min (ref >60)
Glucose, Bld: 143 mg/dL — ABNORMAL HIGH (ref 70–99)
Potassium: 3.9 mmol/L (ref 3.5–5.1)
Sodium: 139 mmol/L (ref 135–145)
Total Bilirubin: 0.6 mg/dL (ref 0.3–1.2)
Total Protein: 6.5 g/dL (ref 6.5–8.1)

## 2022-01-18 LAB — LIPID PANEL
Cholesterol: 232 mg/dL — ABNORMAL HIGH (ref 0–200)
HDL: 40 mg/dL — ABNORMAL LOW (ref >40)
LDL Cholesterol: 165 mg/dL — ABNORMAL HIGH (ref 0–99)
Total CHOL/HDL Ratio: 5.8 RATIO
Triglycerides: 135 mg/dL (ref <150)
VLDL: 27 mg/dL (ref 0–40)

## 2022-01-18 LAB — RESP PANEL BY RT-PCR (FLU A&B, COVID) ARPGX2
Influenza A by PCR: NEGATIVE
Influenza B by PCR: NEGATIVE
SARS Coronavirus 2 by RT PCR: NEGATIVE

## 2022-01-18 LAB — CBC WITH DIFFERENTIAL/PLATELET
Abs Immature Granulocytes: 0.03 10*3/uL (ref 0.00–0.07)
Basophils Absolute: 0.1 10*3/uL (ref 0.0–0.1)
Basophils Relative: 1 %
Eosinophils Absolute: 0.2 10*3/uL (ref 0.0–0.5)
Eosinophils Relative: 2 %
HCT: 36.2 % — ABNORMAL LOW (ref 39.0–52.0)
Hemoglobin: 11.6 g/dL — ABNORMAL LOW (ref 13.0–17.0)
Immature Granulocytes: 0 %
Lymphocytes Relative: 42 %
Lymphs Abs: 5.2 10*3/uL — ABNORMAL HIGH (ref 0.7–4.0)
MCH: 29.7 pg (ref 26.0–34.0)
MCHC: 32 g/dL (ref 30.0–36.0)
MCV: 92.6 fL (ref 80.0–100.0)
Monocytes Absolute: 0.9 10*3/uL (ref 0.1–1.0)
Monocytes Relative: 8 %
Neutro Abs: 6 10*3/uL (ref 1.7–7.7)
Neutrophils Relative %: 47 %
Platelets: 204 10*3/uL (ref 150–400)
RBC: 3.91 MIL/uL — ABNORMAL LOW (ref 4.22–5.81)
RDW: 12.8 % (ref 11.5–15.5)
WBC: 12.4 10*3/uL — ABNORMAL HIGH (ref 4.0–10.5)
nRBC: 0.2 % (ref 0.0–0.2)

## 2022-01-18 LAB — APTT: aPTT: 67 seconds — ABNORMAL HIGH (ref 24–36)

## 2022-01-18 LAB — CARDIAC CATHETERIZATION: Cath EF Quantitative: 60 %

## 2022-01-18 LAB — PROTIME-INR
INR: 1 (ref 0.8–1.2)
Prothrombin Time: 12.8 seconds (ref 11.4–15.2)

## 2022-01-18 LAB — POCT ACTIVATED CLOTTING TIME: Activated Clotting Time: 432 seconds

## 2022-01-18 SURGERY — CORONARY/GRAFT ACUTE MI REVASCULARIZATION
Anesthesia: Moderate Sedation

## 2022-01-18 MED ORDER — ASPIRIN 81 MG PO TBEC
81.0000 mg | DELAYED_RELEASE_TABLET | Freq: Every day | ORAL | Status: DC
  Administered 2022-01-19 – 2022-01-21 (×3): 81 mg via ORAL
  Filled 2022-01-18 (×3): qty 1

## 2022-01-18 MED ORDER — ASPIRIN 81 MG PO CHEW: 81.0000 mg | CHEWABLE_TABLET | Freq: Every day | ORAL | Status: DC

## 2022-01-18 MED ORDER — ASPIRIN 81 MG PO CHEW: 324.0000 mg | CHEWABLE_TABLET | Freq: Once | ORAL | Status: DC

## 2022-01-18 MED ORDER — METOPROLOL TARTRATE 25 MG PO TABS
12.5000 mg | ORAL_TABLET | Freq: Two times a day (BID) | ORAL | Status: DC
  Administered 2022-01-18 – 2022-01-19 (×2): 12.5 mg via ORAL
  Filled 2022-01-18: qty 0.5
  Filled 2022-01-18 (×2): qty 1
  Filled 2022-01-18 (×3): qty 0.5

## 2022-01-18 MED ORDER — NITROGLYCERIN 0.4 MG SL SUBL
0.4000 mg | SUBLINGUAL_TABLET | SUBLINGUAL | Status: DC | PRN
  Administered 2022-01-19: 0.4 mg via SUBLINGUAL
  Filled 2022-01-18: qty 1

## 2022-01-18 MED ORDER — HEPARIN SODIUM (PORCINE) 1000 UNIT/ML IJ SOLN
INTRAMUSCULAR | Status: AC
  Filled 2022-01-18: qty 10

## 2022-01-18 MED ORDER — TICAGRELOR 90 MG PO TABS
ORAL_TABLET | ORAL | Status: DC | PRN
  Administered 2022-01-18: 180 mg via ORAL

## 2022-01-18 MED ORDER — TICAGRELOR 90 MG PO TABS
ORAL_TABLET | ORAL | Status: AC
  Filled 2022-01-18: qty 2

## 2022-01-18 MED ORDER — FENTANYL CITRATE (PF) 100 MCG/2ML IJ SOLN
INTRAMUSCULAR | Status: DC | PRN
  Administered 2022-01-18: 25 ug via INTRAVENOUS

## 2022-01-18 MED ORDER — MIDAZOLAM HCL 2 MG/2ML IJ SOLN
INTRAMUSCULAR | Status: DC | PRN
  Administered 2022-01-18: 1 mg via INTRAVENOUS

## 2022-01-18 MED ORDER — BIVALIRUDIN BOLUS VIA INFUSION - CUPID
INTRAVENOUS | Status: DC | PRN
  Administered 2022-01-18: 79.5 mg via INTRAVENOUS

## 2022-01-18 MED ORDER — FENTANYL CITRATE (PF) 100 MCG/2ML IJ SOLN
INTRAMUSCULAR | Status: AC
  Filled 2022-01-18: qty 2

## 2022-01-18 MED ORDER — SODIUM CHLORIDE 0.9% FLUSH: 3.0000 mL | INTRAVENOUS | Status: DC | PRN

## 2022-01-18 MED ORDER — ATORVASTATIN CALCIUM 80 MG PO TABS
80.0000 mg | ORAL_TABLET | Freq: Every day | ORAL | Status: DC
  Administered 2022-01-19 – 2022-01-21 (×3): 80 mg via ORAL
  Filled 2022-01-18 (×2): qty 1
  Filled 2022-01-18: qty 4

## 2022-01-18 MED ORDER — ZOLPIDEM TARTRATE 5 MG PO TABS
5.0000 mg | ORAL_TABLET | Freq: Every evening | ORAL | Status: DC | PRN
  Filled 2022-01-18: qty 1

## 2022-01-18 MED ORDER — ALPRAZOLAM 0.25 MG PO TABS: 0.2500 mg | ORAL_TABLET | Freq: Two times a day (BID) | ORAL | Status: DC | PRN

## 2022-01-18 MED ORDER — HEPARIN (PORCINE) IN NACL 1000-0.9 UT/500ML-% IV SOLN
INTRAVENOUS | Status: AC
  Filled 2022-01-18: qty 1000

## 2022-01-18 MED ORDER — MIDAZOLAM HCL 2 MG/2ML IJ SOLN
INTRAMUSCULAR | Status: AC
  Filled 2022-01-18: qty 2

## 2022-01-18 MED ORDER — ONDANSETRON HCL 4 MG/2ML IJ SOLN: 4.0000 mg | Freq: Four times a day (QID) | INTRAMUSCULAR | Status: DC | PRN

## 2022-01-18 MED ORDER — SODIUM CHLORIDE 0.9 % IV SOLN: 250.0000 mL | INTRAVENOUS | Status: DC | PRN

## 2022-01-18 MED ORDER — HEPARIN SODIUM (PORCINE) 5000 UNIT/ML IJ SOLN
4000.0000 [IU] | Freq: Once | INTRAMUSCULAR | Status: AC
  Administered 2022-01-18: 4000 [IU] via INTRAVENOUS

## 2022-01-18 MED ORDER — SODIUM CHLORIDE 0.9 % IV SOLN
0.2500 mg/kg/h | INTRAVENOUS | Status: AC
  Filled 2022-01-18: qty 250

## 2022-01-18 MED ORDER — BIVALIRUDIN TRIFLUOROACETATE 250 MG IV SOLR
INTRAVENOUS | Status: AC
  Filled 2022-01-18: qty 250

## 2022-01-18 MED ORDER — TICAGRELOR 90 MG PO TABS
90.0000 mg | ORAL_TABLET | Freq: Two times a day (BID) | ORAL | Status: DC
  Administered 2022-01-19: 90 mg via ORAL
  Filled 2022-01-18: qty 1

## 2022-01-18 MED ORDER — IOHEXOL 300 MG/ML  SOLN
INTRAMUSCULAR | Status: DC | PRN
  Administered 2022-01-18: 185 mL

## 2022-01-18 MED ORDER — SODIUM CHLORIDE 0.9% FLUSH
3.0000 mL | Freq: Two times a day (BID) | INTRAVENOUS | Status: DC
  Administered 2022-01-19 – 2022-01-21 (×4): 3 mL via INTRAVENOUS

## 2022-01-18 MED ORDER — HYDRALAZINE HCL 20 MG/ML IJ SOLN: 10.0000 mg | INTRAMUSCULAR | Status: AC | PRN

## 2022-01-18 MED ORDER — LABETALOL HCL 5 MG/ML IV SOLN
10.0000 mg | INTRAVENOUS | Status: AC | PRN
Start: 2022-01-18 — End: 2022-01-19

## 2022-01-18 MED ORDER — ACETAMINOPHEN 325 MG PO TABS: 650.0000 mg | ORAL_TABLET | ORAL | Status: DC | PRN

## 2022-01-18 MED ORDER — SODIUM CHLORIDE 0.9 % IV SOLN
INTRAVENOUS | Status: AC | PRN
  Administered 2022-01-18: 1.75 mg/kg/h via INTRAVENOUS

## 2022-01-18 MED ORDER — LOSARTAN POTASSIUM 25 MG PO TABS
25.0000 mg | ORAL_TABLET | Freq: Every day | ORAL | Status: DC
  Administered 2022-01-19 – 2022-01-21 (×3): 25 mg via ORAL
  Filled 2022-01-18 (×3): qty 1

## 2022-01-18 MED ORDER — ACETAMINOPHEN 325 MG PO TABS
650.0000 mg | ORAL_TABLET | ORAL | Status: DC | PRN
Start: 2022-01-18 — End: 2022-01-21
  Administered 2022-01-19: 650 mg via ORAL
  Filled 2022-01-18: qty 2

## 2022-01-18 MED ORDER — ASPIRIN 81 MG PO CHEW
CHEWABLE_TABLET | ORAL | Status: AC
  Filled 2022-01-18: qty 4

## 2022-01-18 MED ORDER — ASPIRIN 81 MG PO CHEW
CHEWABLE_TABLET | ORAL | Status: DC | PRN
  Administered 2022-01-18: 324 mg via ORAL

## 2022-01-18 MED ORDER — SODIUM CHLORIDE 0.9 % WEIGHT BASED INFUSION
1.0000 mL/kg/h | INTRAVENOUS | Status: DC
  Administered 2022-01-18 – 2022-01-19 (×2): 1 mL/kg/h via INTRAVENOUS

## 2022-01-18 MED ORDER — SODIUM CHLORIDE 0.9 % IV SOLN: INTRAVENOUS | Status: DC

## 2022-01-18 SURGICAL SUPPLY — 22 items
BALLN TREK RX 2.5X15 (BALLOONS) ×2
BALLOON TREK RX 2.5X15 (BALLOONS) IMPLANT
CATH INFINITI 5FR JL4 (CATHETERS) ×1 IMPLANT
CATH LAUNCHER 6FR JR4 (CATHETERS) ×1 IMPLANT
DEVICE CLOSURE MYNXGRIP 6/7F (Vascular Products) ×1 IMPLANT
DRAPE BRACHIAL (DRAPES) IMPLANT
GLIDESHEATH SLEND SS 6F .021 (SHEATH) IMPLANT
GUIDEWIRE INQWIRE 1.5J.035X260 (WIRE) IMPLANT
INQWIRE 1.5J .035X260CM (WIRE)
KIT ENCORE 26 ADVANTAGE (KITS) ×1 IMPLANT
NDL PERC 18GX7CM (NEEDLE) IMPLANT
NEEDLE PERC 18GX7CM (NEEDLE) ×2 IMPLANT
PACK CARDIAC CATH (CUSTOM PROCEDURE TRAY) ×2 IMPLANT
PROTECTION STATION PRESSURIZED (MISCELLANEOUS) ×2
SET ATX SIMPLICITY (MISCELLANEOUS) ×1 IMPLANT
SHEATH AVANTI 6FR X 11CM (SHEATH) ×2 IMPLANT
STATION PROTECTION PRESSURIZED (MISCELLANEOUS) IMPLANT
STENT ONYX FRONTIER 3.0X12 (Permanent Stent) ×1 IMPLANT
STENT ONYX FRONTIER 3.0X30 (Permanent Stent) ×1 IMPLANT
TUBING CIL FLEX 10 FLL-RA (TUBING) ×1 IMPLANT
WIRE G HI TQ BMW 190 (WIRE) ×1 IMPLANT
WIRE GUIDERIGHT .035X150 (WIRE) ×1 IMPLANT

## 2022-01-18 NOTE — ED Provider Notes (Signed)
[facility] Provider Note    Event Date/Time   First MD Initiated Contact with Patient 01/18/22 2040     (approximate)   History   Code STEMI   HPI  Eliezer Khawaja is a 56 y.o. male who presents to the emergency department today because of concerns for chest pain.  Started after the patient got home from work.  Located in the central chest.  It has been constant since it started.  Patient denies similar pain in the past.  Denies history of heart disease although states that he smokes.  States that there is family history of heart disease. Called code STEMI by EMS. Given ASA during transport.      Physical Exam   Triage Vital Signs: ED Triage Vitals  Enc Vitals Group     BP 01/18/22 2026 105/73     Pulse Rate 01/18/22 2026 87     Resp 01/18/22 2026 20     Temp 01/18/22 2026 98.9 F (37.2 C)     Temp Source 01/18/22 2026 Oral     SpO2 01/18/22 2026 96 %     Weight 01/18/22 2027 233 lb 11 oz (106 kg)     Height 01/18/22 2027 5\' 4"  (1.626 m)     Head Circumference --      Peak Flow --      Pain Score 01/18/22 2027 7     Pain Loc --      Pain Edu? --      Excl. in GC? --     Most recent vital signs: Vitals:   01/18/22 2026 01/18/22 2030  BP: 105/73 105/73  Pulse: 87 71  Resp: 20 19  Temp: 98.9 F (37.2 C)   SpO2: 96% 98%    General: Awake, alert, oriented. CV:  Good peripheral perfusion. Regular rate and rhythm. Resp:  Normal effort. Lungs clear. Abd:  No distention.     ED Results / Procedures / Treatments   Labs (all labs ordered are listed, but only abnormal results are displayed) Labs pending   EKG  I, 03/20/22, attending physician, personally viewed and interpreted this EKG  EKG Time: 2027 Rate: 58 Rhythm: sinus bradycardia Axis: normal Intervals: qtc 382 QRS: narrow ST changes: ST elevation II, III aVF Impression: STEMI     RADIOLOGY None   PROCEDURES:  Critical Care performed: Yes, see critical  care procedure note(s)  Procedures  CRITICAL CARE Performed by: 2028   Total critical care time: 20 minutes  Critical care time was exclusive of separately billable procedures and treating other patients.  Critical care was necessary to treat or prevent imminent or life-threatening deterioration.  Critical care was time spent personally by me on the following activities: development of treatment plan with patient and/or surrogate as well as nursing, discussions with consultants, evaluation of patient's response to treatment, examination of patient, obtaining history from patient or surrogate, ordering and performing treatments and interventions, ordering and review of laboratory studies, ordering and review of radiographic studies, pulse oximetry and re-evaluation of patient's condition.   MEDICATIONS ORDERED IN ED: Medications  0.9 %  sodium chloride infusion ( Intravenous New Bag/Given 01/18/22 2033)  aspirin chewable tablet 324 mg (324 mg Oral Not Given 01/18/22 2031)  heparin injection 4,000 Units (4,000 Units Intravenous Given 01/18/22 2026)     IMPRESSION / MDM / ASSESSMENT AND PLAN / ED COURSE  I reviewed the triage vital signs and the nursing notes.  Differential diagnosis includes, but is not limited to, STEMI, pericarditis  Patient's presentation is most consistent with acute presentation with potential threat to life or bodily function.  Patient presented to the emergency department today because of concerns for chest pain.  EKG performed by EMS on transport was concerning for ST elevation MI.  Was called as a code STEMI.  EKG here in the emergency department is concerning for elevation 2-3 and aVF.  Patient had received aspirin by EMS.  Was given heparin bolus here in the emergency department.  Dr. Juliann Pares with cardiology took patient emergently to the catheterization lab.  FINAL CLINICAL IMPRESSION(S) / ED DIAGNOSES   Final  diagnoses:  ST elevation myocardial infarction (STEMI), unspecified artery (HCC)     Note:  This document was prepared using Dragon voice recognition software and may include unintentional dictation errors.    Phineas Semen, MD 01/18/22 2056

## 2022-01-18 NOTE — H&P (Signed)
History and Physical    Patient: Samuel Dixon [id number]:[id number] DOB: 17-Jul-1965 DOA: 01/18/2022 DOS: the patient was seen and examined on 01/18/2022 PCP: Patient, No Pcp Per  Patient coming from: Home  Chief Complaint:  Chief Complaint  Patient presents with   Code STEMI   HPI: Samuel Dixon is a 56 y.o. male with medical history significant of Tobacco use disorder and family history of heart disease who came into the ED as a code STEMI by EMS after developing central chest pain on arriving home from work.  He was taken to the Cath Lab by [facility] cardiologist, Dr. Juliann Pares where he was found to have a 100% mid to distal RCA lesion and another very distal RCA lesion with DES placement in both lesions.  He was chest pain-free by the end of the procedure.  Hospitalist was asked to admit patient post PCI. Current vitals: Afebrile with pulse 89 respirations 22, BP 121/73 and O2 sat 95% on room air Labs on arrival to the ED with WBC 12,000, hemoglobin 11.6, glucose 143 with otherwise normal CMP first troponin of 11.  Cholesterol 232, LDL 165 and HDL 40 EKG showed ST elevation T2-T3 and aVF  CXR not done  History reviewed. No pertinent past medical history. Past Surgical History:  Procedure Laterality Date   APPENDECTOMY     Social History:  reports that he has been smoking. He has never used smokeless tobacco. He reports that he does not drink alcohol and does not use drugs.  Allergies  Allergen Reactions   Latex Hives   Penicillins Hives   Sulfa Antibiotics Hives    History reviewed. No pertinent family history.  Prior to Admission medications   Medication Sig Start Date End Date Taking? Authorizing Provider  fexofenadine-pseudoephedrine (ALLEGRA-D) 60-120 MG 12 hr tablet Take 1 tablet by mouth 2 (two) times daily. 01/13/17   Joni Reining, PA-C  LORATADINE PO Take by mouth.    [provider]    Physical Exam: Vitals:   01/18/22 2150 01/18/22 2152 01/18/22 2230 01/18/22 2245  BP:  119/73 (!) 117/97 122/83 121/73  Pulse: 85 89 89   Resp: (!) 30 19 (!) 22 (!) 23  Temp:   98.4 F (36.9 C)   TempSrc:   Oral   SpO2: 92% 92% 95%   Weight:   103.8 kg   Height:   5\' 5"  (1.651 m)    Physical Exam Vitals and nursing note reviewed.  Constitutional:      General: He is not in acute distress.    Appearance: He is obese.  HENT:     Head: Normocephalic and atraumatic.  Cardiovascular:     Rate and Rhythm: Normal rate and regular rhythm.     Heart sounds: Normal heart sounds.  Pulmonary:     Effort: Pulmonary effort is normal.     Breath sounds: Normal breath sounds.  Abdominal:     Palpations: Abdomen is soft.     Tenderness: There is no abdominal tenderness.  Neurological:     Mental Status: Mental status is at baseline.     Data Reviewed: Relevant notes from primary care and specialist visits, past discharge summaries as available in EHR, including Care Everywhere. Prior diagnostic testing as pertinent to current admission diagnoses Updated medications and problem lists for reconciliation ED course, including vitals, labs, imaging, treatment and response to treatment Triage notes, nursing and pharmacy notes and ED provider's notes Notable results as noted in HPI   Assessment and Plan: *  STEMI involving right coronary artery [facility]) S/p PCI with DES to the mid and distal RCA Orders per cardiology and includes Angiomax, Brilinta and aspirin, Lipitor, metoprolol, Cozaar, IV hydralazine and labetalol as needed hypertension Cardiology consulted to follow  Obesity (BMI 35.0-39.9 without comorbidity) Lifestyle modification counseling Complicating factor for overall prognosis  Current every day smoker Nicotine patch offered and declined      Advance Care Planning:   Code Status: Full Code   Consults: [facility], Dr. Mariah Milling  Family Communication:   Severity of Illness: The appropriate patient status for this patient is INPATIENT. Inpatient status is judged to  be reasonable and necessary in order to provide the required intensity of service to ensure the patient's safety. The patient's presenting symptoms, physical exam findings, and initial radiographic and laboratory data in the context of their chronic comorbidities is felt to place them at high risk for further clinical deterioration. Furthermore, it is not anticipated that the patient will be medically stable for discharge from the hospital within 2 midnights of admission.   * I certify that at the point of admission it is my clinical judgment that the patient will require inpatient hospital care spanning beyond 2 midnights from the point of admission due to high intensity of service, high risk for further deterioration and high frequency of surveillance required.*  Author: Andris Baumann, MD 01/18/2022 11:05 PM  For on call review [email or web address].

## 2022-01-18 NOTE — ED Notes (Signed)
Aundra Millet, RN attempting to obtain blood work and 2nd IV site, per Cardiologist Dr. Juliann Pares, send patient to cath lab, provide blood tubes and he will obtain blood work while in the cath lab. Pt transported to cath lab by Leanord Hawking, RN and Dr. Juliann Pares at this time.

## 2022-01-18 NOTE — ED Notes (Signed)
Cards @ the bedside.  ?

## 2022-01-18 NOTE — Assessment & Plan Note (Signed)
Lifestyle modification counseling Complicating factor for overall prognosis

## 2022-01-18 NOTE — Assessment & Plan Note (Signed)
Nicotine patch offered. 

## 2022-01-18 NOTE — ED Triage Notes (Signed)
Pt presents via EMS as a CODE Stemi. Pt states his mid-sternal CP started about 45 mins ago. ST elevation noted on the EKG - with pain rating 7/10. Pt received 324mg  ASA PTA with EMS. Bilateral 18G Ivs placed in the R/L AC.   VS HR-64 BP -107/48

## 2022-01-18 NOTE — Assessment & Plan Note (Addendum)
S/p PCI with DES to the mid and distal RCA --cont ASA --switch from Brilinta to plavix today --cont statin

## 2022-01-19 ENCOUNTER — Inpatient Hospital Stay
Admit: 2022-01-19 | Discharge: 2022-01-19 | Disposition: A | Discharge status: Home / Self Care | Payer: BC Managed Care – PPO | Attending: Internal Medicine | Admitting: Internal Medicine

## 2022-01-19 ENCOUNTER — Other Ambulatory Visit: Payer: Self-pay

## 2022-01-19 DIAGNOSIS — I5032 Chronic diastolic (congestive) heart failure: Secondary | ICD-10-CM

## 2022-01-19 DIAGNOSIS — I2111 ST elevation (STEMI) myocardial infarction involving right coronary artery: Principal | ICD-10-CM

## 2022-01-19 LAB — BASIC METABOLIC PANEL
Anion gap: 7 (ref 5–15)
BUN: 15 mg/dL (ref 6–20)
CO2: 23 mmol/L (ref 22–32)
Calcium: 9.1 mg/dL (ref 8.9–10.3)
Chloride: 109 mmol/L (ref 98–111)
Creatinine, Ser: 1.08 mg/dL (ref 0.61–1.24)
GFR, Estimated: >60 mL/min (ref >60)
Glucose, Bld: 125 mg/dL — ABNORMAL HIGH (ref 70–99)
Potassium: 4 mmol/L (ref 3.5–5.1)
Sodium: 139 mmol/L (ref 135–145)

## 2022-01-19 LAB — CBC
HCT: 36.2 % — ABNORMAL LOW (ref 39.0–52.0)
Hemoglobin: 11.5 g/dL — ABNORMAL LOW (ref 13.0–17.0)
MCH: 29.6 pg (ref 26.0–34.0)
MCHC: 31.8 g/dL (ref 30.0–36.0)
MCV: 93.3 fL (ref 80.0–100.0)
Platelets: 199 10*3/uL (ref 150–400)
RBC: 3.88 MIL/uL — ABNORMAL LOW (ref 4.22–5.81)
RDW: 13 % (ref 11.5–15.5)
WBC: 9 10*3/uL (ref 4.0–10.5)
nRBC: 0 % (ref 0.0–0.2)

## 2022-01-19 LAB — HEMOGLOBIN A1C
Hgb A1c MFr Bld: 5.4 % (ref 4.8–5.6)
Mean Plasma Glucose: 108.28 mg/dL

## 2022-01-19 LAB — ECHOCARDIOGRAM COMPLETE
AR max vel: 2.86 cm2
AV Area VTI: 2.56 cm2
AV Area mean vel: 2.37 cm2
AV Mean grad: 3 mmHg
AV Peak grad: 4.9 mmHg
Ao pk vel: 1.11 m/s
Area-P 1/2: 3.51 cm2
Height: 65 in
S' Lateral: 3.03 cm
Weight: 3661.4 oz

## 2022-01-19 LAB — MRSA NEXT GEN BY PCR, NASAL: MRSA by PCR Next Gen: NOT DETECTED

## 2022-01-19 LAB — TROPONIN I (HIGH SENSITIVITY): Troponin I (High Sensitivity): >24000 ng/L (ref <18)

## 2022-01-19 LAB — HIV ANTIBODY (ROUTINE TESTING W REFLEX): HIV Screen 4th Generation wRfx: NONREACTIVE

## 2022-01-19 MED ORDER — PANTOPRAZOLE SODIUM 40 MG PO TBEC
40.0000 mg | DELAYED_RELEASE_TABLET | Freq: Every day | ORAL | Status: DC
  Administered 2022-01-19 – 2022-01-21 (×3): 40 mg via ORAL
  Filled 2022-01-19 (×3): qty 1

## 2022-01-19 MED ORDER — CLOPIDOGREL BISULFATE 75 MG PO TABS: 600.0000 mg | ORAL_TABLET | Freq: Once | ORAL | Status: DC

## 2022-01-19 MED ORDER — ENOXAPARIN SODIUM 60 MG/0.6ML IJ SOSY
0.5000 mg/kg | PREFILLED_SYRINGE | INTRAMUSCULAR | Status: DC
  Administered 2022-01-19 – 2022-01-20 (×2): 52.5 mg via SUBCUTANEOUS
  Filled 2022-01-19: qty 0.53
  Filled 2022-01-19: qty 0.6

## 2022-01-19 MED ORDER — DIPHENHYDRAMINE HCL 25 MG PO CAPS
50.0000 mg | ORAL_CAPSULE | Freq: Four times a day (QID) | ORAL | Status: DC | PRN
  Administered 2022-01-20: 50 mg via ORAL
  Filled 2022-01-19: qty 1
  Filled 2022-01-19 (×2): qty 2
  Filled 2022-01-19: qty 1

## 2022-01-19 MED ORDER — CLOPIDOGREL BISULFATE 75 MG PO TABS
300.0000 mg | ORAL_TABLET | Freq: Once | ORAL | Status: AC
  Administered 2022-01-19: 300 mg via ORAL
  Filled 2022-01-19: qty 4

## 2022-01-19 MED ORDER — CLOPIDOGREL BISULFATE 75 MG PO TABS
75.0000 mg | ORAL_TABLET | Freq: Every day | ORAL | Status: DC
  Administered 2022-01-20 – 2022-01-21 (×2): 75 mg via ORAL
  Filled 2022-01-19 (×2): qty 1

## 2022-01-19 MED ORDER — CHLORHEXIDINE GLUCONATE CLOTH 2 % EX PADS
6.0000 | MEDICATED_PAD | Freq: Every day | CUTANEOUS | Status: DC
  Administered 2022-01-19 – 2022-01-21 (×3): 6 via TOPICAL

## 2022-01-19 NOTE — Progress Notes (Signed)
[facility] Cardiology    SUBJECTIVE: Patient feels much improved denies any significant chest pain sitting up in bed no shortness of breath right groin with no issues or symptoms has not ambulated yet   Vitals:   01/19/22 0600 01/19/22 0800 01/19/22 0900 01/19/22 1000  BP: 119/73 119/77 119/71 119/76  Pulse: 67   69  Resp: (!) 22 (!) 21 20 (!) 24  Temp:      TempSrc:      SpO2: 92%   95%  Weight:      Height:         Intake/Output Summary (Last 24 hours) at 01/19/2022 1126 Last data filed at 01/19/2022 1005 Gross per 24 hour  Intake 1397.08 ml  Output 700 ml  Net 697.08 ml      PHYSICAL EXAM  General: Well developed, well nourished, in no acute distress HEENT:  Normocephalic and atramatic Neck:  No JVD.  Lungs: Clear bilaterally to auscultation and percussion. Heart: HRRR . Normal S1 and S2 without gallops or murmurs.  Abdomen: Bowel sounds are positive, abdomen soft and non-tender  Msk:  Back normal, normal gait. Normal strength and tone for age. Extremities: No clubbing, cyanosis or edema.   Neuro: Alert and oriented X 3. Psych:  Good affect, responds appropriately   LABS: Basic Metabolic Panel: Recent Labs    01/18/22 2028 01/19/22 0557  NA 139 139  K 3.9 4.0  CL 109 109  CO2 24 23  GLUCOSE 143* 125*  BUN 16 15  CREATININE 1.23 1.08  CALCIUM 8.9 9.1   Liver Function Tests: Recent Labs    01/18/22 2028  AST 19  ALT 17  ALKPHOS 80  BILITOT 0.6  PROT 6.5  ALBUMIN 3.7   No results for input(s): "LIPASE", "AMYLASE" in the last 72 hours. CBC: Recent Labs    01/18/22 2028 01/19/22 0557  WBC 12.4* 9.0  NEUTROABS 6.0  --   HGB 11.6* 11.5*  HCT 36.2* 36.2*  MCV 92.6 93.3  PLT 204 199   Cardiac Enzymes: No results for input(s): "CKTOTAL", "CKMB", "CKMBINDEX", "TROPONINI" in the last 72 hours. BNP: Invalid input(s): "POCBNP" D-Dimer: No results for input(s): "DDIMER" in the last 72 hours. Hemoglobin A1C: Recent Labs    01/18/22 2028  HGBA1C 5.4    Fasting Lipid Panel: Recent Labs    01/18/22 2028  CHOL 232*  HDL 40*  LDLCALC 165*  TRIG 135  CHOLHDL 5.8   Thyroid Function Tests: No results for input(s): "TSH", "T4TOTAL", "T3FREE", "THYROIDAB" in the last 72 hours.  Invalid input(s): "FREET3" Anemia Panel: No results for input(s): "VITAMINB12", "FOLATE", "FERRITIN", "TIBC", "IRON", "RETICCTPCT" in the last 72 hours.  CARDIAC CATHETERIZATION  Result Date: 01/18/2022   Mid RCA lesion is 100% stenosed.   Dist RCA lesion is 75% stenosed.   Prox RCA to Mid RCA lesion is 25% stenosed.   Prox LAD to Dist LAD lesion is 25% stenosed.   A drug-eluting stent was successfully placed using a STENT ONYX FRONTIER 3.0X30.   A drug-eluting stent was successfully placed using a STENT ONYX FRONTIER 3.0X12.   Post intervention, there is a 0% residual stenosis.   Post intervention, there is a 0% residual stenosis.   The left ventricular systolic function is normal.   LV end diastolic pressure is normal.   The left ventricular ejection fraction is 55-65% by visual estimate. Conclusion STEMI presentation inferior Diagnostic normal left ventricular function 60% Left coronary system minor irregularities 50-75 no obstructive lesions RCA 100%  occluded mid IRA TIMI 0 flow Intervention PCI and stent to mid RCA IRA vessel reducing lesion from 100 down to 0% restoring TIMI-3 flow from TIMI 0 Stent deployed was a 3.0 x30 mm [place] Onyx  to 14 atm Distal RCA 75% lesion PCI and stent with a 3.0 x12 mm [place] Onyx to 13 atm Lesion reduced from 75 down to 0% TIMI 0 at the beginning of the case TIMI-3 at the end Patient placed on aspirin and Brilinta Angiomax Patient has a reduced rate of Angiomax for additional 2 hours Right groin access was mixed Patient was transported to the ICU for recovery Hospitalist service Dr. Damita Dunnings was contacted for management medical issues No complications Patient tolerated procedure well     Echo pending  TELEMETRY: Telemetry  independently reviewed by me normal sinus rhythm low voltage rate of 70 nonspecific findings:  ASSESSMENT AND PLAN:  Principal Problem:   STEMI involving right coronary artery ([place]) Active Problems:   S/P cardiac catheterization   Current every day smoker   Obesity (BMI 35.0-39.9 without comorbidity) Status post PCI and stent DES to RCA x2 Possible obstructive sleep apnea Hyperlipidemia Elevated troponins  Plan Agree with ICU level care patient can be transferred to telemetry today Continue Brilinta 90 mg twice a day Agree with aspirin 81 mg daily Statin therapy for hyperlipidemia Have the patient refrain from tobacco abuse Recommend sleep study CPAP if indicated weight loss Patient should be referred to cardiac rehab    Yolonda Kida, MD 01/19/2022 11:26 AM

## 2022-01-19 NOTE — Progress Notes (Signed)
  PROGRESS NOTE    Samuel Dixon  ZOX:[id number] DOB: 1965/08/15 DOA: 01/18/2022 PCP: Patient, No Pcp Per  IC14A/IC14A-AA  LOS: 1 day   Brief hospital course: No notes on file  Assessment & Plan: Samuel Dixon is a 56 y.o. male with medical history significant of Tobacco use disorder and family history of heart disease who came into the ED as a code STEMI by EMS after developing central chest pain on arriving home from work.  He was taken to the Cath Lab by [facility] cardiologist, Dr. Juliann Pares where he was found to have a 100% mid to distal RCA lesion and another very distal RCA lesion with DES placement in both lesions.  He was chest pain-free by the end of the procedure.    * STEMI involving right coronary artery (HCC) S/p PCI with DES to the mid and distal RCA --cont Brilinta and ASA --cont statin  Chronic diastolic CHF (congestive heart failure) (HCC) Echo with grade I DD.  Not exacerbated.  Obesity (BMI 35.0-39.9 without comorbidity) Lifestyle modification counseling Complicating factor for overall prognosis  Current every day smoker Nicotine patch offered and declined    DVT prophylaxis: Lovenox SQ Code Status: Full code  Family Communication: girlfriend updated at bedside today Level of care: Telemetry Cardiac Dispo:   The patient is from: home Anticipated d/c is to: home Anticipated d/c date is: likely tomorrow   Subjective and Interval History:  Pt denied chest pain, no dyspnea.     Objective: Vitals:   01/19/22 1000 01/19/22 1526 01/19/22 1600 01/19/22 1700  BP: 119/76 112/79 110/67   Pulse: 69 (!) 58 (!) 57   Resp: (!) 24 19 20    Temp:    99 F (37.2 C)  TempSrc:    Oral  SpO2: 95% 98% 99%   Weight:      Height:        Intake/Output Summary (Last 24 hours) at 01/19/2022 1739 Last data filed at 01/19/2022 1005 Gross per 24 hour  Intake 1397.08 ml  Output 700 ml  Net 697.08 ml   Filed Weights   01/18/22 2027 01/18/22 2230  Weight: 106 kg 103.8 kg     Examination:   Constitutional: NAD, AAOx3, sitting in recliner HEENT: conjunctivae and lids normal, EOMI CV: No cyanosis.   RESP: normal respiratory effort, on RA Neuro: II - XII grossly intact.   Psych: Normal mood and affect.  Appropriate judgement and reason   Data Reviewed: I have personally reviewed labs and imaging studies  Time spent: 35 minutes  03/20/22, MD [facility] If 7PM-7AM, please contact night-coverage 01/19/2022, 5:39 PM

## 2022-01-19 NOTE — Progress Notes (Signed)
PHARMACIST - PHYSICIAN COMMUNICATION  CONCERNING:  Enoxaparin (Lovenox) for DVT Prophylaxis    RECOMMENDATION: Patient was prescribed enoxaprin 40mg  q24 hours for VTE prophylaxis.   Filed Weights   01/18/22 2027 01/18/22 2230  Weight: 106 kg (233 lb 11 oz) 103.8 kg (228 lb 13.4 oz)    Body mass index is 38.08 kg/m.  Estimated Creatinine Clearance: 83.7 mL/min (by C-G formula based on SCr of 1.08 mg/dL).   Based on [facility] policy patient is candidate for enoxaparin 0.5mg /kg TBW SQ every 24 hours based on BMI being >30.  DESCRIPTION: Pharmacy has adjusted enoxaparin dose per [facility] policy.  Patient is now receiving enoxaparin 52.5 mg (0.5 mg/kg) every 24 hours    [facility], PharmD Clinical Pharmacist  01/19/2022 5:45 PM

## 2022-01-19 NOTE — Assessment & Plan Note (Addendum)
Echo with grade I DD.  Not exacerbated.

## 2022-01-19 NOTE — Progress Notes (Signed)
Pt noted to have frequent alarm on telemetry for bradycardia into the 30's, as well as "missed beats", 2.4 and 2.6 pauses. Dr. Juliann Pares and Dr. Fran Lowes notified. Pt also complaining of rash and itching on arms, left arm has raised rash which appear to be hives. Pt requested benadryl. Dr. Juliann Pares and Dr. Fran Lowes also notified of this. Benadryl ordered, Brilinta dc'd and Plavix ordered.

## 2022-01-19 NOTE — Plan of Care (Signed)
  Problem: Education: Goal: Knowledge of General Education information will improve Description: Including pain rating scale, medication(s)/side effects and non-pharmacologic comfort measures Outcome: Progressing   Problem: Health Behavior/Discharge Planning: Goal: Ability to manage health-related needs will improve Outcome: Progressing   Problem: Clinical Measurements: Goal: Ability to maintain clinical measurements within normal limits will improve Outcome: Progressing Goal: Will remain free from infection Outcome: Progressing Goal: Diagnostic test results will improve Outcome: Progressing Goal: Respiratory complications will improve Outcome: Progressing Goal: Cardiovascular complication will be avoided Outcome: Progressing   Problem: Activity: Goal: Risk for activity intolerance will decrease Outcome: Progressing   Problem: Nutrition: Goal: Adequate nutrition will be maintained Outcome: Progressing   Problem: Coping: Goal: Level of anxiety will decrease Outcome: Progressing   Problem: Elimination: Goal: Will not experience complications related to bowel motility Outcome: Progressing Goal: Will not experience complications related to urinary retention Outcome: Progressing   Problem: Pain Managment: Goal: General experience of comfort will improve Outcome: Progressing   Problem: Skin Integrity: Goal: Risk for impaired skin integrity will decrease Outcome: Progressing   Problem: Safety: Goal: Ability to remain free from injury will improve Outcome: Progressing   Problem: Education: Goal: Understanding of cardiac disease, CV risk reduction, and recovery process will improve Outcome: Progressing Goal: Individualized Educational Video(s) Outcome: Progressing   Problem: Activity: Goal: Ability to tolerate increased activity will improve Outcome: Progressing   Problem: Cardiac: Goal: Ability to achieve and maintain adequate cardiovascular perfusion will  improve Outcome: Progressing   Problem: Health Behavior/Discharge Planning: Goal: Ability to safely manage health-related needs after discharge will improve Outcome: Progressing

## 2022-01-19 NOTE — Consult Note (Signed)
CARDIOLOGY CONSULT NOTE               Patient ID: Samuel Dixon MRN: [id number] DOB/AGE: [birth year] 56 y.o.  Admit date: 01/18/2022 Referring Physician Dr. Judd Gaudier hospitalist Primary Physician none Primary Cardiologist none Reason for Consultation code STEMI inferior wall  HPI: Patient is a 56 year old obese black male presents with acute chest discomfort at around 7:45pm while sitting watching TV after work.  Patient has a long history of tobacco abuse family history heart disease.  Patient midsternal chest pain eventually called EMS he was brought to the emergency room code STEMI was initiated patient was found to have abnormal EKG. patient subsequently was taken to the Cath Lab for PCI and stent RCA where he received 2 DES stents mid RCA and distal RCA.  Patient tolerated procedure well and then was subsequently admitted to ICU.  Review of systems complete and found to be negative unless listed above     History reviewed. No pertinent past medical history.  Past Surgical History:  Procedure Laterality Date   APPENDECTOMY      Medications Prior to Admission  Medication Sig Dispense Refill Last Dose   fexofenadine-pseudoephedrine (ALLEGRA-D) 60-120 MG 12 hr tablet Take 1 tablet by mouth 2 (two) times daily. 20 tablet 0    LORATADINE PO Take by mouth.      Social History   Socioeconomic History   Marital status: Single    Spouse name: Not on file   Number of children: Not on file   Years of education: Not on file   Highest education level: Not on file  Occupational History   Not on file  Tobacco Use   Smoking status: Every Day   Smokeless tobacco: Never  Substance and Sexual Activity   Alcohol use: No   Drug use: No   Sexual activity: Not on file  Other Topics Concern   Not on file  Social History Narrative   Not on file   Social Determinants of Health   Financial Resource Strain: Not on file  Food Insecurity: Not on file  Transportation Needs: Not on  file  Physical Activity: Not on file  Stress: Not on file  Social Connections: Not on file  Intimate Partner Violence: Not on file    History reviewed. No pertinent family history.    Review of systems complete and found to be negative unless listed above      PHYSICAL EXAM  General: Well developed, well nourished, in acute distress because of chest pain HEENT:  Normocephalic and atramatic Neck:  No JVD.  Lungs: Clear bilaterally to auscultation and percussion. Heart: HRRR . Normal S1 and S2 without gallops or murmurs.  Abdomen: Bowel sounds are positive, abdomen soft and non-tender  Msk:  Back normal, normal gait. Normal strength and tone for age. Extremities: No clubbing, cyanosis or edema.   Neuro: Alert and oriented X 3. Psych:  Good affect, responds appropriately  Labs:   Lab Results  Component Value Date   WBC 9.0 01/19/2022   HGB 11.5 (L) 01/19/2022   HCT 36.2 (L) 01/19/2022   MCV 93.3 01/19/2022   PLT 199 01/19/2022    Recent Labs  Lab 01/18/22 2028 01/19/22 0557  NA 139 139  K 3.9 4.0  CL 109 109  CO2 24 23  BUN 16 15  CREATININE 1.23 1.08  CALCIUM 8.9 9.1  PROT 6.5  --   BILITOT 0.6  --   ALKPHOS 80  --   ALT  17  --   AST 19  --   GLUCOSE 143* 125*   No results found for: "CKTOTAL", "CKMB", "CKMBINDEX", "TROPONINI"  Lab Results  Component Value Date   CHOL 232 (H) 01/18/2022   Lab Results  Component Value Date   HDL 40 (L) 01/18/2022   Lab Results  Component Value Date   LDLCALC 165 (H) 01/18/2022   Lab Results  Component Value Date   TRIG 135 01/18/2022   Lab Results  Component Value Date   CHOLHDL 5.8 01/18/2022   No results found for: "LDLDIRECT"    Radiology: CARDIAC CATHETERIZATION  Result Date: 01/18/2022   Mid RCA lesion is 100% stenosed.   Dist RCA lesion is 75% stenosed.   Prox RCA to Mid RCA lesion is 25% stenosed.   Prox LAD to Dist LAD lesion is 25% stenosed.   A drug-eluting stent was successfully placed using a  STENT ONYX FRONTIER 3.0X30.   A drug-eluting stent was successfully placed using a STENT ONYX FRONTIER 3.0X12.   Post intervention, there is a 0% residual stenosis.   Post intervention, there is a 0% residual stenosis.   The left ventricular systolic function is normal.   LV end diastolic pressure is normal.   The left ventricular ejection fraction is 55-65% by visual estimate. Conclusion STEMI presentation inferior Diagnostic normal left ventricular function 60% Left coronary system minor irregularities 50-75 no obstructive lesions RCA 100% occluded mid IRA TIMI 0 flow Intervention PCI and stent to mid RCA IRA vessel reducing lesion from 100 down to 0% restoring TIMI-3 flow from TIMI 0 Stent deployed was a 3.0 x30 mm Frontera Onyx  to 14 atm Distal RCA 75% lesion PCI and stent with a 3.0 x12 mm Frontier Onyx to 13 atm Lesion reduced from 75 down to 0% TIMI 0 at the beginning of the case TIMI-3 at the end Patient placed on aspirin and Brilinta Angiomax Patient has a reduced rate of Angiomax for additional 2 hours Right groin access was mixed Patient was transported to the ICU for recovery Hospitalist service Dr. Damita Dunnings was contacted for management medical issues No complications Patient tolerated procedure well    EKG: EKG independently reviewed by me suggests acute myocardial infarction STEMI ST elevation inferiorly with reciprocal changes laterally rate of 60  ASSESSMENT AND PLAN:  Code STEMI Acute inferior wall myocardial infarction Morbid obesity Smoking Probable obstructive sleep apnea Hyperlipidemia . Plan Agree with intention to treat acute cardiac cath and coronary intervention for RCA Recommend Brilinta aspirin for 12 months Recommend statin therapy indefinitely moderate to high-dose Blood pressure management and control ARB beta-blocker Ultimately recommend cardiac rehab Engage in significant weight loss exercise portion control Patient is to receive Angiomax for additional 2 hours and  then discontinue Patient should be in ICU overnight then transferred to telemetry for increased activity and mobility  Signed: Yolonda Kida MD 01/19/2022, 11:38 AM

## 2022-01-19 NOTE — Progress Notes (Signed)
Patient ambulated in hallway without difficulty, (once around perimeter of ICU) . Denied CP, SOB, dizziness or weakness. Right groin unremarkable; dressing changed on right groin cath site. No bleeding or hematoma, does endorse soreness. NSR/SB on telemetry.

## 2022-01-20 DIAGNOSIS — I2111 ST elevation (STEMI) myocardial infarction involving right coronary artery: Secondary | ICD-10-CM | POA: Diagnosis not present

## 2022-01-20 LAB — CBC
HCT: 37.3 % — ABNORMAL LOW (ref 39.0–52.0)
Hemoglobin: 11.9 g/dL — ABNORMAL LOW (ref 13.0–17.0)
MCH: 30.1 pg (ref 26.0–34.0)
MCHC: 31.9 g/dL (ref 30.0–36.0)
MCV: 94.2 fL (ref 80.0–100.0)
Platelets: 177 10*3/uL (ref 150–400)
RBC: 3.96 MIL/uL — ABNORMAL LOW (ref 4.22–5.81)
RDW: 12.9 % (ref 11.5–15.5)
WBC: 8.3 10*3/uL (ref 4.0–10.5)
nRBC: 0 % (ref 0.0–0.2)

## 2022-01-20 LAB — BASIC METABOLIC PANEL
Anion gap: 7 (ref 5–15)
BUN: 17 mg/dL (ref 6–20)
CO2: 22 mmol/L (ref 22–32)
Calcium: 9.2 mg/dL (ref 8.9–10.3)
Chloride: 109 mmol/L (ref 98–111)
Creatinine, Ser: 1.16 mg/dL (ref 0.61–1.24)
GFR, Estimated: >60 mL/min (ref >60)
Glucose, Bld: 140 mg/dL — ABNORMAL HIGH (ref 70–99)
Potassium: 4.1 mmol/L (ref 3.5–5.1)
Sodium: 138 mmol/L (ref 135–145)

## 2022-01-20 LAB — MAGNESIUM: Magnesium: 2.2 mg/dL (ref 1.7–2.4)

## 2022-01-20 MED ORDER — PREDNISONE 10 MG (21) PO TBPK: 20.0000 mg | ORAL_TABLET | Freq: Every evening | ORAL | Status: DC

## 2022-01-20 MED ORDER — PREDNISONE 10 MG (21) PO TBPK: 10.0000 mg | ORAL_TABLET | ORAL | Status: DC

## 2022-01-20 MED ORDER — PREDNISONE 10 MG (21) PO TBPK
20.0000 mg | ORAL_TABLET | Freq: Every morning | ORAL | Status: AC
  Filled 2022-01-20: qty 21

## 2022-01-20 MED ORDER — PREDNISONE 10 MG (21) PO TBPK
10.0000 mg | ORAL_TABLET | Freq: Three times a day (TID) | ORAL | Status: DC
  Administered 2022-01-21: 10 mg via ORAL

## 2022-01-20 MED ORDER — PREDNISONE 10 MG (21) PO TBPK: 10.0000 mg | ORAL_TABLET | Freq: Four times a day (QID) | ORAL | Status: DC

## 2022-01-20 MED ORDER — HYDROCORTISONE 0.5 % EX CREA
TOPICAL_CREAM | Freq: Three times a day (TID) | CUTANEOUS | Status: DC
  Filled 2022-01-20: qty 28.35

## 2022-01-20 MED ORDER — HYDROXYZINE HCL 25 MG PO TABS
50.0000 mg | ORAL_TABLET | Freq: Four times a day (QID) | ORAL | Status: DC | PRN
Start: 2022-01-20 — End: 2022-01-21
  Administered 2022-01-20: 50 mg via ORAL
  Filled 2022-01-20: qty 2

## 2022-01-20 NOTE — Progress Notes (Signed)
  PROGRESS NOTE    Samuel Dixon  HEN:[id number] DOB: [birth year] DOA: 01/18/2022 PCP: Patient, No Pcp Per  250A/250A-AA  LOS: 2 days   Brief hospital course: No notes on file  Assessment & Plan: Samuel Dixon is a 56 y.o. male with medical history significant of Tobacco use disorder and family history of heart disease who came into the ED as a code STEMI by EMS after developing central chest pain on arriving home from work.  He was taken to the Cath Lab by [facility] cardiologist, Dr. Juliann Pares where he was found to have a 100% mid to distal RCA lesion and another very distal RCA lesion with DES placement in both lesions.  He was chest pain-free by the end of the procedure.    * STEMI involving right coronary artery (HCC) S/p PCI with DES to the mid and distal RCA --cont ASA --switch from Brilinta to plavix today --cont statin  Chronic diastolic CHF (congestive heart failure) (HCC) Echo with grade I DD.  Not exacerbated.  Obesity (BMI 35.0-39.9 without comorbidity) Lifestyle modification counseling Complicating factor for overall prognosis  Current every day smoker Nicotine patch offered and declined    DVT prophylaxis: Lovenox SQ Code Status: Full code  Family Communication: girlfriend updated at bedside today Level of care: Progressive Dispo:   The patient is from: home Anticipated d/c is to: home Anticipated d/c date is: likely tomorrow   Subjective and Interval History:  Pt developed rash and blisters on both upper arms, likely from blood pressure cuffs.  Pt noted to have 2 sinus pauses,  2.4 and 4.8 seconds.   Objective: Vitals:   01/20/22 0544 01/20/22 0759 01/20/22 1159 01/20/22 1530  BP: 109/72 109/78 110/84 105/65  Pulse: 62 63 63 74  Resp: 20 16 18 16   Temp: 98.3 F (36.8 C) 98.6 F (37 C) 98 F (36.7 C) 98.2 F (36.8 C)  TempSrc: Oral Oral Oral Oral  SpO2: 100% 99% 100% 100%  Weight:      Height:        Intake/Output Summary (Last 24 hours) at 01/20/2022  1805 Last data filed at 01/20/2022 1200 Gross per 24 hour  Intake 779.17 ml  Output 1125 ml  Net -345.83 ml   Filed Weights   01/18/22 2027 01/18/22 2230  Weight: 106 kg 103.8 kg    Examination:   Constitutional: NAD, AAOx3 HEENT: conjunctivae and lids normal, EOMI CV: No cyanosis.   RESP: normal respiratory effort, on RA Extremities: both upper arms wrapped SKIN: warm, dry Neuro: II - XII grossly intact.   Psych: flat mood and affect.     Data Reviewed: I have personally reviewed labs and imaging studies  Time spent: 25 minutes  03/20/22, MD [facility] If 7PM-7AM, please contact night-coverage 01/20/2022, 6:05 PM

## 2022-01-20 NOTE — Progress Notes (Signed)
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 Per nurse report some bisters of left upper arm have opened, and weepy  Patient also stated he wanted to leave AMA  Action - discussed with patient high risk period post STEMI and PCI. Patient agreed to stay  Hydrocortisone for right upper extremity rash, Vaseline gauze and kerlix left upper extremity Benadryl for the itching  Response - Patient agreed to stay

## 2022-01-20 NOTE — Progress Notes (Signed)
CCMD reported pt's HR dropped to 37 bpm with a 2.4 second pause, pt asymptomatic. MD made aware, no new orders. Care ongoing.     UPDATE 0850: CCMD reported 4.8 second pause, pt asymptomatic, MD notified, no new orders, care ongoing.

## 2022-01-20 NOTE — Progress Notes (Signed)
[facility] Cardiology    SUBJECTIVE: Patient denies any significant chest pain no shortness of breath is ambulated in the halls without difficulty still has a small rash his left forearm on the blood pressure cuff still has some pruritus minimal improvement with Benadryl in general feels reasonably well   Vitals:   01/20/22 0544 01/20/22 0759 01/20/22 1159 01/20/22 1530  BP: 109/72 109/78 110/84 105/65  Pulse: 62 63 63 74  Resp: _0 Temp: 98.3 F (36.8 C) 98.6 F (37 C) 98 F (36.7 C) 98.2 F (36.8 C)  TempSrc: Oral Oral    SpO2: 100% 99% 100% 100%  Weight:      Height:         Intake/Output Summary (Last 24 hours) at 01/20/2022 1702 Last data filed at 01/20/2022 1200 Gross per 24 hour  Intake 419.17 ml  Output 1125 ml  Net -705.83 ml      PHYSICAL EXAM  General: Well developed, well nourished, in no acute distress HEENT:  Normocephalic and atramatic Neck:  No JVD.  Lungs: Clear bilaterally to auscultation and percussion. Heart: HRRR . Normal S1 and S2 without gallops or murmurs.  Abdomen: Bowel sounds are positive, abdomen soft and non-tender  Msk:  Back normal, normal gait. Normal strength and tone for age. Extremities: No clubbing, cyanosis or edema.   Neuro: Alert and oriented X 3. Psych:  Good affect, responds appropriately   LABS: Basic Metabolic Panel: Recent Labs    01/19/22 0557 01/20/22 0450  NA 139 138  K 4.0 4.1  CL 109 109  CO2 23 22  GLUCOSE 125* 140*  BUN 15 17  CREATININE 1.08 1.16  CALCIUM 9.1 9.2  MG  --  2.2   Liver Function Tests: Recent Labs    01/18/22 2028  AST 19  ALT 17  ALKPHOS 80  BILITOT 0.6  PROT 6.5  ALBUMIN 3.7   No results for input(s): "LIPASE", "AMYLASE" in the last 72 hours. CBC: Recent Labs    01/18/22 2028 01/19/22 0557 01/20/22 0450  WBC 12.4* 9.0 8.3  NEUTROABS 6.0  --   --   HGB 11.6* 11.5* 11.9*  HCT 36.2* 36.2* 37.3*  MCV 92.6 93.3 94.2  PLT 204 199 177   Cardiac Enzymes: No results for  input(s): "CKTOTAL", "CKMB", "CKMBINDEX", "TROPONINI" in the last 72 hours. BNP: Invalid input(s): "POCBNP" D-Dimer: No results for input(s): "DDIMER" in the last 72 hours. Hemoglobin A1C: Recent Labs    01/18/22 2028  HGBA1C 5.4   Fasting Lipid Panel: Recent Labs    01/18/22 2028  CHOL 232*  HDL 40*  LDLCALC 165*  TRIG 135  CHOLHDL 5.8   Thyroid Function Tests: No results for input(s): "TSH", "T4TOTAL", "T3FREE", "THYROIDAB" in the last 72 hours.  Invalid input(s): "FREET3" Anemia Panel: No results for input(s): "VITAMINB12", "FOLATE", "FERRITIN", "TIBC", "IRON", "RETICCTPCT" in the last 72 hours.  ECHOCARDIOGRAM COMPLETE  Result Date: 01/19/2022    ECHOCARDIOGRAM REPORT   Patient Name:   Samuel Dixon Date of Exam: 01/19/2022 Medical Rec #:  [id number]    Height:       65.0 in Accession #:    [id number]   Weight:       228.8 lb Date of Birth:  08-Dec-1965     BSA:          2.095 m Patient Age:    56 years     BP:           119/76 mmHg  Patient Gender: M            HR:           57 bpm. Exam Location:  [facility] Procedure: 2D Echo, Cardiac Doppler and Color Doppler Indications:     I24.9 Acute Ischemic heart disease  History:         Patient has no prior history of Echocardiogram examinations.                  NSTEMI, Coronary stents; Risk Factors:Current Smoker.  Sonographer:     Rosalia Hammers Referring Phys:  [place] Diagnosing Phys: Neoma Laming  Sonographer Comments: Technically challenging study due to limited acoustic windows. Image acquisition challenging due to patient body habitus. IMPRESSIONS  1. Left ventricular ejection fraction, by estimation, is 60 to 65%. The left ventricle has normal function. The left ventricle has no regional wall motion abnormalities. There is mild concentric left ventricular hypertrophy. Left ventricular diastolic parameters are consistent with Grade I diastolic dysfunction (impaired relaxation).  2. Right ventricular systolic function is  normal. The right ventricular size is normal.  3. The mitral valve is normal in structure. No evidence of mitral valve regurgitation. No evidence of mitral stenosis.  4. The aortic valve is normal in structure. Aortic valve regurgitation is not visualized. No aortic stenosis is present.  5. The inferior vena cava is normal in size with greater than 50% respiratory variability, suggesting right atrial pressure of 3 mmHg. FINDINGS  Left Ventricle: Left ventricular ejection fraction, by estimation, is 60 to 65%. The left ventricle has normal function. The left ventricle has no regional wall motion abnormalities. The left ventricular internal cavity size was normal in size. There is  mild concentric left ventricular hypertrophy. Left ventricular diastolic parameters are consistent with Grade I diastolic dysfunction (impaired relaxation). Right Ventricle: The right ventricular size is normal. No increase in right ventricular wall thickness. Right ventricular systolic function is normal. Left Atrium: Left atrial size was normal in size. Right Atrium: Right atrial size was normal in size. Pericardium: There is no evidence of pericardial effusion. Mitral Valve: The mitral valve is normal in structure. No evidence of mitral valve regurgitation. No evidence of mitral valve stenosis. Tricuspid Valve: The tricuspid valve is normal in structure. Tricuspid valve regurgitation is not demonstrated. No evidence of tricuspid stenosis. Aortic Valve: The aortic valve is normal in structure. Aortic valve regurgitation is not visualized. No aortic stenosis is present. Aortic valve mean gradient measures 3.0 mmHg. Aortic valve peak gradient measures 4.9 mmHg. Aortic valve area, by VTI measures 2.56 cm. Pulmonic Valve: The pulmonic valve was normal in structure. Pulmonic valve regurgitation is not visualized. No evidence of pulmonic stenosis. Aorta: The aortic root is normal in size and structure. Venous: The inferior vena cava is normal  in size with greater than 50% respiratory variability, suggesting right atrial pressure of 3 mmHg. IAS/Shunts: No atrial level shunt detected by color flow Doppler.  LEFT VENTRICLE PLAX 2D LVIDd:         4.79 cm   Diastology LVIDs:         3.03 cm   LV e' medial:    8.92 cm/s LV PW:         1.15 cm   LV E/e' medial:  6.3 LV IVS:        0.92 cm   LV e' lateral:   12.30 cm/s LVOT diam:     2.00 cm   LV E/e' lateral: 4.6  LV SV:         59 LV SV Index:   28 LVOT Area:     3.14 cm  RIGHT VENTRICLE RV Basal diam:  2.86 cm RV S prime:     11.90 cm/s TAPSE (M-mode): 1.6 cm LEFT ATRIUM             Index        RIGHT ATRIUM           Index LA diam:        3.30 cm 1.58 cm/m   RA Area:     16.10 cm LA Vol (A2C):   58.7 ml 28.02 ml/m  RA Volume:   46.20 ml  22.06 ml/m LA Vol (A4C):   43.9 ml 20.96 ml/m LA Biplane Vol: 51.7 ml 24.68 ml/m  AORTIC VALVE AV Area (Vmax):    2.86 cm AV Area (Vmean):   2.37 cm AV Area (VTI):     2.56 cm AV Vmax:           111.00 cm/s AV Vmean:          88.000 cm/s AV VTI:            0.232 m AV Peak Grad:      4.9 mmHg AV Mean Grad:      3.0 mmHg LVOT Vmax:         101.00 cm/s LVOT Vmean:        66.400 cm/s LVOT VTI:          0.189 m LVOT/AV VTI ratio: 0.81  AORTA Ao Root diam: 3.10 cm MITRAL VALVE MV Area (PHT): 3.51 cm    SHUNTS MV Decel Time: 216 msec    Systemic VTI:  0.19 m MV E velocity: 56.10 cm/s  Systemic Diam: 2.00 cm MV A velocity: 69.00 cm/s MV E/A ratio:  0.81 Shaukat Khan Electronically signed by Neoma Laming Signature Date/Time: 01/19/2022/2:17:51 PM    Final    CARDIAC CATHETERIZATION  Result Date: 01/18/2022   Mid RCA lesion is 100% stenosed.   Dist RCA lesion is 75% stenosed.   Prox RCA to Mid RCA lesion is 25% stenosed.   Prox LAD to Dist LAD lesion is 25% stenosed.   A drug-eluting stent was successfully placed using a STENT ONYX FRONTIER 3.0X30.   A drug-eluting stent was successfully placed using a STENT ONYX FRONTIER 3.0X12.   Post intervention, there is a 0% residual  stenosis.   Post intervention, there is a 0% residual stenosis.   The left ventricular systolic function is normal.   LV end diastolic pressure is normal.   The left ventricular ejection fraction is 55-65% by visual estimate. Conclusion STEMI presentation inferior Diagnostic normal left ventricular function 60% Left coronary system minor irregularities 50-75 no obstructive lesions RCA 100% occluded mid IRA TIMI 0 flow Intervention PCI and stent to mid RCA IRA vessel reducing lesion from 100 down to 0% restoring TIMI-3 flow from TIMI 0 Stent deployed was a 3.0 x30 mm Frontera Onyx  to 14 atm Distal RCA 75% lesion PCI and stent with a 3.0 x12 mm Frontier Onyx to 13 atm Lesion reduced from 75 down to 0% TIMI 0 at the beginning of the case TIMI-3 at the end Patient placed on aspirin and Brilinta Angiomax Patient has a reduced rate of Angiomax for additional 2 hours Right groin access was mixed Patient was transported to the ICU for recovery Hospitalist service Dr. Damita Dunnings was contacted for management medical issues No  complications Patient tolerated procedure well     Echo preserved left ventricular function EF around 60 to 65%  TELEMETRY: Sinus bradycardia rate of 55 nonspecific ST-T wave changes:  ASSESSMENT AND PLAN:  Principal Problem:   STEMI involving right coronary artery (HCC) Active Problems:   S/P cardiac catheterization   Current every day smoker   Obesity (BMI 35.0-39.9 without comorbidity)   Chronic diastolic CHF (congestive heart failure) (HCC) Bradycardia Forearm rash Obesity   Plan Postop day 2 STEMI inferior wall continue current medications Aspirin statin ARB Plavix will hold beta-blocker because of bradycardia We will switch to Atarax since Benadryl is not working consider prednisone taper Continue to advised to refrain from smoking Recommend weight loss exercise portion control Elevated troponins suggestive of a STEMI continue current therapy EKG consistent with inferior  MI Recommend patient proceed with cardiac rehab upon discharge Hopefully consider discharge tomorrow   Yolonda Kida, MD 01/20/2022 5:02 PM

## 2022-01-20 NOTE — Progress Notes (Signed)
Manuela Schwartz, NP came to bedside to look at new rash to right arm and left arm blisters. New orders placed.

## 2022-01-20 NOTE — Progress Notes (Signed)
Report called to 2A will move patient shortly.

## 2022-01-20 NOTE — Progress Notes (Signed)
Placed Vaseline gauze over blisters and told patient to let me know if there was any changes.

## 2022-01-20 NOTE — Progress Notes (Signed)
Patient complaining of rash to left upper arm itching. PRN benadryl requested from Pharmacy. Looked at the area and noted new blisters. Multiple, small blisters noted around the bicep and outside of his arm. MD made aware. I looked over the rest of his arm, abdomen, back, and right arm, there were other issues.

## 2022-01-21 ENCOUNTER — Encounter: Payer: Self-pay | Admitting: Internal Medicine

## 2022-01-21 DIAGNOSIS — I2111 ST elevation (STEMI) myocardial infarction involving right coronary artery: Secondary | ICD-10-CM | POA: Diagnosis not present

## 2022-01-21 LAB — BASIC METABOLIC PANEL
Anion gap: 5 (ref 5–15)
BUN: 18 mg/dL (ref 6–20)
CO2: 24 mmol/L (ref 22–32)
Calcium: 8.8 mg/dL — ABNORMAL LOW (ref 8.9–10.3)
Chloride: 108 mmol/L (ref 98–111)
Creatinine, Ser: 1.22 mg/dL (ref 0.61–1.24)
GFR, Estimated: >60 mL/min (ref >60)
Glucose, Bld: 112 mg/dL — ABNORMAL HIGH (ref 70–99)
Potassium: 4.2 mmol/L (ref 3.5–5.1)
Sodium: 137 mmol/L (ref 135–145)

## 2022-01-21 LAB — GLUCOSE, CAPILLARY: Glucose-Capillary: 141 mg/dL — ABNORMAL HIGH (ref 70–99)

## 2022-01-21 LAB — CBC
HCT: 36.8 % — ABNORMAL LOW (ref 39.0–52.0)
Hemoglobin: 11.5 g/dL — ABNORMAL LOW (ref 13.0–17.0)
MCH: 29.2 pg (ref 26.0–34.0)
MCHC: 31.3 g/dL (ref 30.0–36.0)
MCV: 93.4 fL (ref 80.0–100.0)
Platelets: 192 10*3/uL (ref 150–400)
RBC: 3.94 MIL/uL — ABNORMAL LOW (ref 4.22–5.81)
RDW: 12.9 % (ref 11.5–15.5)
WBC: 7.2 10*3/uL (ref 4.0–10.5)
nRBC: 0 % (ref 0.0–0.2)

## 2022-01-21 LAB — MAGNESIUM: Magnesium: 2.3 mg/dL (ref 1.7–2.4)

## 2022-01-21 MED ORDER — ASPIRIN 81 MG PO TBEC: 81.0000 mg | DELAYED_RELEASE_TABLET | Freq: Every day | ORAL | Status: AC

## 2022-01-21 MED ORDER — HYDROCORTISONE 0.5 % EX CREA: TOPICAL_CREAM | Freq: Three times a day (TID) | CUTANEOUS | 0 refills | Status: AC

## 2022-01-21 MED ORDER — CLOPIDOGREL BISULFATE 75 MG PO TABS: 75.0000 mg | ORAL_TABLET | Freq: Every day | ORAL | 11 refills | Status: AC

## 2022-01-21 MED ORDER — LOSARTAN POTASSIUM 25 MG PO TABS: 25.0000 mg | ORAL_TABLET | Freq: Every day | ORAL | 2 refills | Status: AC

## 2022-01-21 MED ORDER — ATORVASTATIN CALCIUM 80 MG PO TABS: 80.0000 mg | ORAL_TABLET | Freq: Every day | ORAL | 2 refills | Status: AC

## 2022-01-21 NOTE — Discharge Summary (Signed)
Physician Discharge Summary   Samuel Dixon  male DOB: 04/13/1966  OIZ:[id number]  PCP: Patient, No Pcp Per  Admit date: 01/18/2022 Discharge date: 01/21/2022  Admitted From: home Disposition:  home CODE STATUS: Full code  Discharge Instructions     AMB Referral to Cardiac Rehabilitation - Phase II   Complete by: As directed    Diagnosis: STEMI   After initial evaluation and assessments completed: Virtual Based Care may be provided alone or in conjunction with Phase 2 Cardiac Rehab based on patient barriers.: Yes   Discharge instructions   Complete by: As directed    You have had a heart attack and a stent placed.  Please take aspirin 81 mg and plavix 75 mg daily together for 1 year, and can not miss them.  Cardiologist also started you on Losartan and Lipitor.  Please see your PCP for sleep study.  Please follow up with Dr. Clayborn Bigness as outpatient.   Dr. Enzo Bi [facility] Course:  For full details, please see H&P, progress notes, consult notes and ancillary notes.  Briefly,  Samuel Dixon is a 56 y.o. male with medical history significant of Tobacco use disorder and family history of heart disease who came into the ED as a code STEMI by EMS after developing central chest pain on arriving home from work.    He was taken to the Cath Lab by [facility] cardiologist, Dr. Clayborn Bigness where he was found to have a 100% mid to distal RCA lesion and another very distal RCA lesion with DES placement in both lesions.  He was chest pain-free by the end of the procedure.    * STEMI involving right coronary artery [facility]) S/p PCI with DES to the mid and distal RCA --discharged on ASA 81 and plavix 75 daily together for 1 year. --cont statin, started on Losartan. --hold beta-blocker, per cardio, because of bradycardia and noted sinus pauses.   --follow up with Dr. Clayborn Bigness as outpatient   Chronic diastolic CHF (congestive heart failure) ([place]) Echo with grade I DD.  Not exacerbated.    Obesity (BMI 35.0-39.9 without comorbidity) Lifestyle modification counseling Complicating factor for overall prognosis   Current every day smoker Nicotine patch offered and declined  Contact dermatitis --pt developed rash and small blisters on left upper arm and right forearm, where the blood pressure cuff was placed.   --hydrocortisone cream TID for 7 days  Unless noted above, medications under "STOP" list are ones pt was not taking PTA.  Discharge Diagnoses:  Principal Problem:   STEMI involving right coronary artery (HCC) Active Problems:   S/P cardiac catheterization   Current every day smoker   Obesity (BMI 35.0-39.9 without comorbidity)   Chronic diastolic CHF (congestive heart failure) ([place])   30 Day Unplanned Readmission Risk Score    Flowsheet Row ED to Hosp-Admission (Current) from 01/18/2022 in [facility]  30 Day Unplanned Readmission Risk Score (%) 9.47 Filed at 01/21/2022 0801       This score is the patient's risk of an unplanned readmission within 30 days of being discharged (0 -100%). The score is based on dignosis, age, lab data, medications, orders, and past utilization.   Low:  0-14.9   Medium: 15-21.9   High: 22-29.9   Extreme: 30 and above         Discharge Instructions:  Allergies as of 01/21/2022       Reactions   Latex Hives   Penicillins Hives  Sulfa Antibiotics Hives        Medication List     STOP taking these medications    fexofenadine-pseudoephedrine 60-120 MG 12 hr tablet Commonly known as: ALLEGRA-D   LORATADINE PO       TAKE these medications    aspirin EC 81 MG tablet Take 1 tablet (81 mg total) by mouth daily. Swallow whole. Start taking on: January 22, 2022   atorvastatin 80 MG tablet Commonly known as: LIPITOR Take 1 tablet (80 mg total) by mouth daily. Start taking on: January 22, 2022   clopidogrel 75 MG tablet Commonly known as: PLAVIX Take 1 tablet (75 mg total) by mouth  daily. Start taking on: January 22, 2022   hydrocortisone cream 0.5 % Apply topically 3 (three) times daily for 7 days.   losartan 25 MG tablet Commonly known as: COZAAR Take 1 tablet (25 mg total) by mouth daily. Start taking on: January 22, 2022         Follow-up Information     Lujean Amel D, MD Follow up in 1 week(s).   Specialties: Cardiology, Internal Medicine Contact information: [place] [address] [phone]                 Allergies  Allergen Reactions   Latex Hives   Penicillins Hives   Sulfa Antibiotics Hives     The results of significant diagnostics from this hospitalization (including imaging, microbiology, ancillary and laboratory) are listed below for reference.   Consultations:   Procedures/Studies: ECHOCARDIOGRAM COMPLETE  Result Date: 01/19/2022    ECHOCARDIOGRAM REPORT   Patient Name:   Samuel Dixon Date of Exam: 01/19/2022 Medical Rec #:  [id number]    Height:       65.0 in Accession #:    [id number]   Weight:       228.8 lb Date of Birth:  [birth year]     BSA:          2.095 m Patient Age:    56 years     BP:           119/76 mmHg Patient Gender: M            HR:           57 bpm. Exam Location:  [facility] Procedure: 2D Echo, Cardiac Doppler and Color Doppler Indications:     I24.9 Acute Ischemic heart disease  History:         Patient has no prior history of Echocardiogram examinations.                  NSTEMI, Coronary stents; Risk Factors:Current Smoker.  Sonographer:     Rosalia Hammers Referring Phys:  [place] Diagnosing Phys: Neoma Laming  Sonographer Comments: Technically challenging study due to limited acoustic windows. Image acquisition challenging due to patient body habitus. IMPRESSIONS  1. Left ventricular ejection fraction, by estimation, is 60 to 65%. The left ventricle has normal function. The left ventricle has no regional wall motion abnormalities. There is mild concentric left ventricular  hypertrophy. Left ventricular diastolic parameters are consistent with Grade I diastolic dysfunction (impaired relaxation).  2. Right ventricular systolic function is normal. The right ventricular size is normal.  3. The mitral valve is normal in structure. No evidence of mitral valve regurgitation. No evidence of mitral stenosis.  4. The aortic valve is normal in structure. Aortic valve regurgitation is not visualized. No aortic stenosis is present.  5. The inferior  vena cava is normal in size with greater than 50% respiratory variability, suggesting right atrial pressure of 3 mmHg. FINDINGS  Left Ventricle: Left ventricular ejection fraction, by estimation, is 60 to 65%. The left ventricle has normal function. The left ventricle has no regional wall motion abnormalities. The left ventricular internal cavity size was normal in size. There is  mild concentric left ventricular hypertrophy. Left ventricular diastolic parameters are consistent with Grade I diastolic dysfunction (impaired relaxation). Right Ventricle: The right ventricular size is normal. No increase in right ventricular wall thickness. Right ventricular systolic function is normal. Left Atrium: Left atrial size was normal in size. Right Atrium: Right atrial size was normal in size. Pericardium: There is no evidence of pericardial effusion. Mitral Valve: The mitral valve is normal in structure. No evidence of mitral valve regurgitation. No evidence of mitral valve stenosis. Tricuspid Valve: The tricuspid valve is normal in structure. Tricuspid valve regurgitation is not demonstrated. No evidence of tricuspid stenosis. Aortic Valve: The aortic valve is normal in structure. Aortic valve regurgitation is not visualized. No aortic stenosis is present. Aortic valve mean gradient measures 3.0 mmHg. Aortic valve peak gradient measures 4.9 mmHg. Aortic valve area, by VTI measures 2.56 cm. Pulmonic Valve: The pulmonic valve was normal in structure. Pulmonic  valve regurgitation is not visualized. No evidence of pulmonic stenosis. Aorta: The aortic root is normal in size and structure. Venous: The inferior vena cava is normal in size with greater than 50% respiratory variability, suggesting right atrial pressure of 3 mmHg. IAS/Shunts: No atrial level shunt detected by color flow Doppler.  LEFT VENTRICLE PLAX 2D LVIDd:         4.79 cm   Diastology LVIDs:         3.03 cm   LV e' medial:    8.92 cm/s LV PW:         1.15 cm   LV E/e' medial:  6.3 LV IVS:        0.92 cm   LV e' lateral:   12.30 cm/s LVOT diam:     2.00 cm   LV E/e' lateral: 4.6 LV SV:         59 LV SV Index:   28 LVOT Area:     3.14 cm  RIGHT VENTRICLE RV Basal diam:  2.86 cm RV S prime:     11.90 cm/s TAPSE (M-mode): 1.6 cm LEFT ATRIUM             Index        RIGHT ATRIUM           Index LA diam:        3.30 cm 1.58 cm/m   RA Area:     16.10 cm LA Vol (A2C):   58.7 ml 28.02 ml/m  RA Volume:   46.20 ml  22.06 ml/m LA Vol (A4C):   43.9 ml 20.96 ml/m LA Biplane Vol: 51.7 ml 24.68 ml/m  AORTIC VALVE AV Area (Vmax):    2.86 cm AV Area (Vmean):   2.37 cm AV Area (VTI):     2.56 cm AV Vmax:           111.00 cm/s AV Vmean:          88.000 cm/s AV VTI:            0.232 m AV Peak Grad:      4.9 mmHg AV Mean Grad:      3.0 mmHg LVOT Vmax:  101.00 cm/s LVOT Vmean:        66.400 cm/s LVOT VTI:          0.189 m LVOT/AV VTI ratio: 0.81  AORTA Ao Root diam: 3.10 cm MITRAL VALVE MV Area (PHT): 3.51 cm    SHUNTS MV Decel Time: 216 msec    Systemic VTI:  0.19 m MV E velocity: 56.10 cm/s  Systemic Diam: 2.00 cm MV A velocity: 69.00 cm/s MV E/A ratio:  0.81 Shaukat Khan Electronically signed by Neoma Laming Signature Date/Time: 01/19/2022/2:17:51 PM    Final    CARDIAC CATHETERIZATION  Result Date: 01/18/2022   Mid RCA lesion is 100% stenosed.   Dist RCA lesion is 75% stenosed.   Prox RCA to Mid RCA lesion is 25% stenosed.   Prox LAD to Dist LAD lesion is 25% stenosed.   A drug-eluting stent was successfully  placed using a STENT ONYX FRONTIER 3.0X30.   A drug-eluting stent was successfully placed using a STENT ONYX FRONTIER 3.0X12.   Post intervention, there is a 0% residual stenosis.   Post intervention, there is a 0% residual stenosis.   The left ventricular systolic function is normal.   LV end diastolic pressure is normal.   The left ventricular ejection fraction is 55-65% by visual estimate. Conclusion STEMI presentation inferior Diagnostic normal left ventricular function 60% Left coronary system minor irregularities 50-75 no obstructive lesions RCA 100% occluded mid IRA TIMI 0 flow Intervention PCI and stent to mid RCA IRA vessel reducing lesion from 100 down to 0% restoring TIMI-3 flow from TIMI 0 Stent deployed was a 3.0 x30 mm [place] Onyx  to 14 atm Distal RCA 75% lesion PCI and stent with a 3.0 x12 mm Frontier Onyx to 13 atm Lesion reduced from 75 down to 0% TIMI 0 at the beginning of the case TIMI-3 at the end Patient placed on aspirin and Brilinta Angiomax Patient has a reduced rate of Angiomax for additional 2 hours Right groin access was mixed Patient was transported to the ICU for recovery Hospitalist service Dr. Damita Dunnings was contacted for management medical issues No complications Patient tolerated procedure well      Labs: BNP (last 3 results) No results for input(s): "BNP" in the last 8760 hours. Basic Metabolic Panel: Recent Labs  Lab 01/18/22 2028 01/19/22 0557 01/20/22 0450 01/21/22 0404  NA 139 139 138 137  K 3.9 4.0 4.1 4.2  CL 109 109 109 108  CO2 '24 23 22 24  ' GLUCOSE 143* 125* 140* 112*  BUN '16 15 17 18  ' CREATININE 1.23 1.08 1.16 1.22  CALCIUM 8.9 9.1 9.2 8.8*  MG  --   --  2.2 2.3   Liver Function Tests: Recent Labs  Lab 01/18/22 2028  AST 19  ALT 17  ALKPHOS 80  BILITOT 0.6  PROT 6.5  ALBUMIN 3.7   No results for input(s): "LIPASE", "AMYLASE" in the last 168 hours. No results for input(s): "AMMONIA" in the last 168 hours. CBC: Recent Labs  Lab  01/18/22 2028 01/19/22 0557 01/20/22 0450 01/21/22 0404  WBC 12.4* 9.0 8.3 7.2  NEUTROABS 6.0  --   --   --   HGB 11.6* 11.5* 11.9* 11.5*  HCT 36.2* 36.2* 37.3* 36.8*  MCV 92.6 93.3 94.2 93.4  PLT 204 199 177 192   Cardiac Enzymes: No results for input(s): "CKTOTAL", "CKMB", "CKMBINDEX", "TROPONINI" in the last 168 hours. BNP: Invalid input(s): "POCBNP" CBG: Recent Labs  Lab 01/18/22 2215  GLUCAP 141*  D-Dimer No results for input(s): "DDIMER" in the last 72 hours. Hgb A1c Recent Labs    01/18/22 2028  HGBA1C 5.4   Lipid Profile Recent Labs    01/18/22 2028  CHOL 232*  HDL 40*  LDLCALC 165*  TRIG 135  CHOLHDL 5.8   Thyroid function studies No results for input(s): "TSH", "T4TOTAL", "T3FREE", "THYROIDAB" in the last 72 hours.  Invalid input(s): "FREET3" Anemia work up No results for input(s): "VITAMINB12", "FOLATE", "FERRITIN", "TIBC", "IRON", "RETICCTPCT" in the last 72 hours. Urinalysis    Component Value Date/Time   COLORURINE YELLOW (A) 03/22/2016 0751   APPEARANCEUR CLEAR (A) 03/22/2016 0751   LABSPEC 1.020 03/22/2016 0751   PHURINE 6.0 03/22/2016 0751   GLUCOSEU 50 (A) 03/22/2016 0751   HGBUR NEGATIVE 03/22/2016 0751   BILIRUBINUR NEGATIVE 03/22/2016 0751   KETONESUR NEGATIVE 03/22/2016 0751   PROTEINUR NEGATIVE 03/22/2016 0751   NITRITE NEGATIVE 03/22/2016 0751   LEUKOCYTESUR NEGATIVE 03/22/2016 0751   Sepsis Labs Recent Labs  Lab 01/18/22 2028 01/19/22 0557 01/20/22 0450 01/21/22 0404  WBC 12.4* 9.0 8.3 7.2   Microbiology Recent Results (from the past 240 hour(s))  Resp Panel by RT-PCR (Flu A&B, Covid) Anterior Nasal Swab     Status: None   Collection Time: 01/18/22  8:28 PM   Specimen: Anterior Nasal Swab  Result Value Ref Range Status   SARS Coronavirus 2 by RT PCR NEGATIVE NEGATIVE Final    Comment: (NOTE) SARS-CoV-2 target nucleic acids are NOT DETECTED.  The SARS-CoV-2 RNA is generally detectable in upper  respiratory specimens during the acute phase of infection. The lowest concentration of SARS-CoV-2 viral copies this assay can detect is 138 copies/mL. A negative result does not preclude SARS-Cov-2 infection and should not be used as the sole basis for treatment or other patient management decisions. A negative result may occur with  improper specimen collection/handling, submission of specimen other than nasopharyngeal swab, presence of viral mutation(s) within the areas targeted by this assay, and inadequate number of viral copies(<138 copies/mL). A negative result must be combined with clinical observations, patient history, and epidemiological information. The expected result is Negative.  Fact Sheet for Patients:  EntrepreneurPulse.com.au  Fact Sheet for Healthcare Providers:  IncredibleEmployment.be  This test is no t yet approved or cleared by the [place] FDA and  has been authorized for detection and/or diagnosis of SARS-CoV-2 by FDA under an Emergency Use Authorization (EUA). This EUA will remain  in effect (meaning this test can be used) for the duration of the COVID-19 declaration under Section 564(b)(1) of the Act, 21 U.S.C.section 360bbb-3(b)(1), unless the authorization is terminated  or revoked sooner.       Influenza A by PCR NEGATIVE NEGATIVE Final   Influenza B by PCR NEGATIVE NEGATIVE Final    Comment: (NOTE) The Xpert Xpress SARS-CoV-2/FLU/RSV plus assay is intended as an aid in the diagnosis of influenza from Nasopharyngeal swab specimens and should not be used as a sole basis for treatment. Nasal washings and aspirates are unacceptable for Xpert Xpress SARS-CoV-2/FLU/RSV testing.  Fact Sheet for Patients: EntrepreneurPulse.com.au  Fact Sheet for Healthcare Providers: IncredibleEmployment.be  This test is not yet approved or cleared by the [place] FDA and has been  authorized for detection and/or diagnosis of SARS-CoV-2 by FDA under an Emergency Use Authorization (EUA). This EUA will remain in effect (meaning this test can be used) for the duration of the COVID-19 declaration under Section 564(b)(1) of the Act, 21 U.S.C. section 360bbb-3(b)(1), unless the  authorization is terminated or revoked.  Performed at [facility], [address] [place], [place] [address]   MRSA Next Gen by PCR, Nasal     Status: None   Collection Time: 01/18/22 10:40 PM   Specimen: Nasal Mucosa; Nasal Swab  Result Value Ref Range Status   MRSA by PCR Next Gen NOT DETECTED NOT DETECTED Final    Comment: (NOTE) The GeneXpert MRSA Assay (FDA approved for NASAL specimens only), is one component of a comprehensive MRSA colonization surveillance program. It is not intended to diagnose MRSA infection nor to guide or monitor treatment for MRSA infections. Test performance is not FDA approved in patients less than 70 years old. Performed at [facility], [address] [place], [place] [address]      Total time spend on discharging this patient, including the last patient exam, discussing the hospital stay, instructions for ongoing care as it relates to all pertinent caregivers, as well as preparing the medical discharge records, prescriptions, and/or referrals as applicable, is 40 minutes.    Enzo Bi, MD  [facility] 01/21/2022, 10:14 AM

## 2022-01-21 NOTE — Progress Notes (Signed)
CCMD reports 3.9 sec pause. Patient is asymptomatic

## 2022-01-21 NOTE — Progress Notes (Signed)
  Transition of Care [facility]) Screening Note   Patient Details  Name: Samuel Dixon Date of Birth: [birth year]   Transition of Care [facility]) CM/SW Contact:    Gildardo Griffes, LCSW Phone Number: 01/21/2022, 11:09 AM    Transition of Care Department [facility] [place]) has reviewed patient and no TOC needs have been identified at this time. We will continue to monitor patient advancement through interdisciplinary progression rounds. If new patient transition needs arise, please place a TOC consult.  [place], [place] [phone]

## 2022-01-22 LAB — LIPOPROTEIN A (LPA): Lipoprotein (a): 232.8 nmol/L — ABNORMAL HIGH (ref <75.0)

## 2022-04-29 ENCOUNTER — Other Ambulatory Visit: Payer: Self-pay | Admitting: Urology

## 2022-04-30 LAB — PSA: Prostate Specific Ag, Serum: 0.4 ng/mL (ref 0.0–4.0)

## 2022-05-01 ENCOUNTER — Telehealth: Payer: Self-pay

## 2022-05-01 NOTE — Telephone Encounter (Signed)
Patient informed normal PSA results, 0.4, recheck in 1 year, verbalized understanding.

## 2022-05-02 ENCOUNTER — Telehealth: Payer: Self-pay

## 2022-05-02 NOTE — Telephone Encounter (Signed)
Patient informed normal PSA results, 0.4, repeat in 1 year, verbalized understanding.
